# Patient Record
Sex: Female | Born: 1985
Health system: Southern US, Community
[De-identification: ages and names within clinical notes are randomized; demographics above are authoritative.]

## PROBLEM LIST (undated history)

## (undated) ENCOUNTER — Inpatient Hospital Stay (HOSPITAL_COMMUNITY): Payer: Self-pay

## (undated) DIAGNOSIS — R51 Headache: Secondary | ICD-10-CM

## (undated) DIAGNOSIS — A599 Trichomoniasis, unspecified: Secondary | ICD-10-CM

## (undated) DIAGNOSIS — R519 Headache, unspecified: Secondary | ICD-10-CM

## (undated) DIAGNOSIS — R7303 Prediabetes: Secondary | ICD-10-CM

## (undated) DIAGNOSIS — T7840XA Allergy, unspecified, initial encounter: Secondary | ICD-10-CM

## (undated) DIAGNOSIS — Z9289 Personal history of other medical treatment: Secondary | ICD-10-CM

## (undated) DIAGNOSIS — Z973 Presence of spectacles and contact lenses: Secondary | ICD-10-CM

## (undated) HISTORY — DX: Presence of spectacles and contact lenses: Z97.3

## (undated) HISTORY — PX: EYE MUSCLE SURGERY: SHX370

## (undated) HISTORY — DX: Personal history of other medical treatment: Z92.89

## (undated) HISTORY — DX: Headache, unspecified: R51.9

## (undated) HISTORY — PX: WISDOM TOOTH EXTRACTION: SHX21

## (undated) HISTORY — DX: Allergy, unspecified, initial encounter: T78.40XA

---
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
## 2006-11-20 ENCOUNTER — Emergency Department (HOSPITAL_COMMUNITY): Admission: EM | Admit: 2006-11-20 | Discharge: 2006-11-20 | Payer: Self-pay | Admitting: Emergency Medicine

## 2007-04-13 ENCOUNTER — Emergency Department (HOSPITAL_COMMUNITY): Admission: EM | Admit: 2007-04-13 | Discharge: 2007-04-13 | Payer: Self-pay | Admitting: Emergency Medicine

## 2010-05-19 ENCOUNTER — Emergency Department (HOSPITAL_COMMUNITY)
Admission: EM | Admit: 2010-05-19 | Discharge: 2010-05-19 | Disposition: A | Discharge status: Home / Self Care | Payer: No Typology Code available for payment source | Attending: Emergency Medicine | Admitting: Emergency Medicine

## 2010-05-19 DIAGNOSIS — T148XXA Other injury of unspecified body region, initial encounter: Secondary | ICD-10-CM | POA: Insufficient documentation

## 2010-05-19 DIAGNOSIS — S335XXA Sprain of ligaments of lumbar spine, initial encounter: Secondary | ICD-10-CM | POA: Insufficient documentation

## 2010-05-19 DIAGNOSIS — M25519 Pain in unspecified shoulder: Secondary | ICD-10-CM | POA: Insufficient documentation

## 2010-05-19 DIAGNOSIS — M549 Dorsalgia, unspecified: Secondary | ICD-10-CM | POA: Insufficient documentation

## 2010-05-22 ENCOUNTER — Emergency Department (HOSPITAL_COMMUNITY)
Admission: EM | Admit: 2010-05-22 | Discharge: 2010-05-22 | Disposition: A | Discharge status: Home / Self Care | Payer: No Typology Code available for payment source | Attending: Emergency Medicine | Admitting: Emergency Medicine

## 2010-05-22 ENCOUNTER — Emergency Department (HOSPITAL_COMMUNITY): Payer: No Typology Code available for payment source

## 2010-05-22 DIAGNOSIS — R079 Chest pain, unspecified: Secondary | ICD-10-CM | POA: Insufficient documentation

## 2010-05-22 DIAGNOSIS — M545 Low back pain, unspecified: Secondary | ICD-10-CM | POA: Insufficient documentation

## 2010-05-22 DIAGNOSIS — R0602 Shortness of breath: Secondary | ICD-10-CM | POA: Insufficient documentation

## 2010-05-22 DIAGNOSIS — T148XXA Other injury of unspecified body region, initial encounter: Secondary | ICD-10-CM | POA: Insufficient documentation

## 2010-05-22 DIAGNOSIS — M549 Dorsalgia, unspecified: Secondary | ICD-10-CM | POA: Insufficient documentation

## 2010-05-22 DIAGNOSIS — Y92410 Unspecified street and highway as the place of occurrence of the external cause: Secondary | ICD-10-CM | POA: Insufficient documentation

## 2010-06-08 NOTE — Op Note (Signed)
Jenny Yu, Jenny Yu                ACCOUNT NO.:  [id number]   MEDICAL RECORD NO.:  [id number]          PATIENT TYPE:  AMB   LOCATION:  DSC                          FACILITY:  [facility]   PHYSICIAN:  Casimiro Needle A. Karleen Hampshire, M.D.DATE OF BIRTH:  August 04, 1985   DATE OF PROCEDURE:  12/14/2003  DATE OF DISCHARGE:                                 OPERATIVE REPORT   PREOPERATIVE DIAGNOSIS:  Esotropia with dissociated vertical deviation, OU.   PROCEDURE:  Bilateral medial rectus recessions, 5.5 mm, with bilateral  inferior oblique anteriorizations.   SURGEON:  Tyrone Apple. Karleen Hampshire, M.D.   ANESTHESIA:  General with laryngeal mask airway.   INDICATIONS FOR PROCEDURE:  Jenny Yu is a 25 year old black female with  esotropia and dissociated vertical deviation bilaterally. This procedure is  indicated to restore alignment of the visual axis and to improve the visual  function and visual acuity. The risks and benefits of the procedure  explained to the patient and the patient's parents prior to the procedure,  and informed consent was obtained.   DESCRIPTION OF TECHNIQUE:  The patient was taken to the operating room and  placed in a supine position, and after the induction of general anesthesia  and establishment of adequate laryngeal mask airway, the entire face was  prepped and draped in the usual sterile manner. Our attention was first  turned to the right eye. Forced duction testing performed and found to be  negative. The globe was then held in the inferior nasal quadrant, the eye  was elevated and abducted. An incision was made through the inferior nasal  fornix, taken down to the posterior subtenon space. The right medial rectus  muscle was then isolated on a Stevens hook, subsequently on a Green hook. A  second Green hook was then passed beneath the tendon of the muscle. This was  then used to hold the globe in an elevated and abducted position. The right  medial rectus muscle was then carefully  isolated from its overlying muscle  fascia, and the muscular septum was cut, and the tendon was then imbricated  on 6-0 Vicryl suture taking 2 locking bites on the ends. It was then  detached from the globe and recessed exactly 5.5 mm from its insertion,  reattached to the globe using preplaced sutures. The sutures were tied  securely. The conjunctivae was repositioned. An identical left medial rectus  recession 5.5 mm was performed following this using the technique outlined  above. Attention was then turned to the inferior oblique of the left eye.  The globe was held in the inferior temporal quadrant. It was elevated and  adducted. Incision was then made through the inferior temporal fornix, taken  down to the posterior subtenon space. The left lateral rectus muscle was  then isolated on a Stevens hook, subsequently on a Green hook. The second  Green hook was passed beneath the tendon of the muscle. This was used to  hold the globe and elevate it in adducted position. The inferior oblique was  then isolated coursing from its origin and the anterior floor of the orbit  to  its insertion in the posterior inferior temporal quadrant of the globe.  It was carefully dissected free from its overlying muscle fascia. It was  then imbricated with 6-0 Vicryl suture. It was detached from the globe and  the anteriorized to the level of the ipsilateral inferior rectus muscle. It  was reattached using preplaced suture, suture tied securely. The  conjunctivae was repositioned. Attention was then turned to the fellow right  eye where a right inferior oblique anteriorization was performed using a  technique outlined above. There were no apparent complications at the  conclusion of the procedure. Antibiotic ointment was instilled inferior  fornices in both eyes.      MAS/MEDQ  D:  12/14/2003  T:  12/14/2003  Job:  [id number]

## 2010-10-15 LAB — URINALYSIS, ROUTINE W REFLEX MICROSCOPIC
Hgb urine dipstick: NEGATIVE
Nitrite: NEGATIVE
Protein, ur: NEGATIVE
Specific Gravity, Urine: 1.014
Urobilinogen, UA: 0.2

## 2010-10-15 LAB — DIFFERENTIAL
Basophils Absolute: 0
Basophils Relative: 0
Lymphocytes Relative: 33
Neutro Abs: 5.7
Neutrophils Relative %: 61

## 2010-10-15 LAB — BASIC METABOLIC PANEL
BUN: 3 — ABNORMAL LOW
CO2: 24
Calcium: 9.1
Creatinine, Ser: 0.59
GFR calc non Af Amer: >60
Glucose, Bld: 117 — ABNORMAL HIGH
Sodium: 138

## 2010-10-15 LAB — CBC
MCHC: 32.7
Platelets: 515 — ABNORMAL HIGH
RDW: 15.9 — ABNORMAL HIGH

## 2010-10-15 LAB — PREGNANCY, URINE: Preg Test, Ur: NEGATIVE

## 2010-10-31 LAB — RAPID STREP SCREEN (MED CTR MEBANE ONLY): Streptococcus, Group A Screen (Direct): NEGATIVE

## 2013-02-10 ENCOUNTER — Encounter (HOSPITAL_COMMUNITY): Payer: Self-pay | Admitting: *Deleted

## 2013-02-10 ENCOUNTER — Inpatient Hospital Stay (HOSPITAL_COMMUNITY): Payer: BC Managed Care – PPO

## 2013-02-10 ENCOUNTER — Inpatient Hospital Stay (HOSPITAL_COMMUNITY)
Admission: AD | Admit: 2013-02-10 | Discharge: 2013-02-10 | Disposition: A | Discharge status: Home / Self Care | Payer: BC Managed Care – PPO | Source: Ambulatory Visit | Attending: Obstetrics and Gynecology | Admitting: Obstetrics and Gynecology

## 2013-02-10 DIAGNOSIS — O26859 Spotting complicating pregnancy, unspecified trimester: Secondary | ICD-10-CM | POA: Insufficient documentation

## 2013-02-10 DIAGNOSIS — Z3201 Encounter for pregnancy test, result positive: Secondary | ICD-10-CM

## 2013-02-10 DIAGNOSIS — O26851 Spotting complicating pregnancy, first trimester: Secondary | ICD-10-CM

## 2013-02-10 DIAGNOSIS — R109 Unspecified abdominal pain: Secondary | ICD-10-CM | POA: Insufficient documentation

## 2013-02-10 HISTORY — DX: Headache: R51

## 2013-02-10 HISTORY — DX: Trichomoniasis, unspecified: A59.9

## 2013-02-10 LAB — CBC
HCT: 37.6 % (ref 36.0–46.0)
Hemoglobin: 12.3 g/dL (ref 12.0–15.0)
MCH: 25.1 pg — ABNORMAL LOW (ref 26.0–34.0)
MCHC: 32.7 g/dL (ref 30.0–36.0)
MCV: 76.6 fL — AB (ref 78.0–100.0)
PLATELETS: 438 10*3/uL — AB (ref 150–400)
RBC: 4.91 MIL/uL (ref 3.87–5.11)
RDW: 14.3 % (ref 11.5–15.5)
WBC: 12 10*3/uL — AB (ref 4.0–10.5)

## 2013-02-10 LAB — URINALYSIS, ROUTINE W REFLEX MICROSCOPIC
Bilirubin Urine: NEGATIVE
Glucose, UA: NEGATIVE mg/dL
Ketones, ur: NEGATIVE mg/dL
Leukocytes, UA: NEGATIVE
Nitrite: NEGATIVE
Protein, ur: NEGATIVE mg/dL
SPECIFIC GRAVITY, URINE: 1.025 (ref 1.005–1.030)
UROBILINOGEN UA: 0.2 mg/dL (ref 0.0–1.0)
pH: 6.5 (ref 5.0–8.0)

## 2013-02-10 LAB — URINE MICROSCOPIC-ADD ON

## 2013-02-10 LAB — WET PREP, GENITAL
Clue Cells Wet Prep HPF POC: NONE SEEN
TRICH WET PREP: NONE SEEN
YEAST WET PREP: NONE SEEN

## 2013-02-10 LAB — ABO/RH: ABO/RH(D): O POS

## 2013-02-10 LAB — POCT PREGNANCY, URINE: Preg Test, Ur: POSITIVE — AB

## 2013-02-10 LAB — HCG, QUANTITATIVE, PREGNANCY: HCG, BETA CHAIN, QUANT, S: 153 m[IU]/mL — AB (ref <5)

## 2013-02-10 NOTE — Discharge Instructions (Signed)
Ectopic Pregnancy °An ectopic pregnancy is when the fertilized egg attaches (implants) outside the uterus. Most ectopic pregnancies occur in the fallopian tube. Rarely do ectopic pregnancies occur on the ovary, intestine, pelvis, or cervix. In an ectopic pregnancy, the fertilized egg does not have the ability to develop into a normal, healthy baby.  °A ruptured ectopic pregnancy is one in which the fallopian tube gets torn or bursts and results in internal bleeding. Often there is intense abdominal pain, and sometimes, vaginal bleeding. Having an ectopic pregnancy can be life threatening. If left untreated, this dangerous condition can lead to a blood transfusion, abdominal surgery, or even death. °CAUSES  °Damage to the fallopian tubes is the suspected cause in most ectopic pregnancies.  °RISK FACTORS °Depending on your circumstances, the risk of having an ectopic pregnancy will vary. The level of risk can be divided into three categories. °High Risk °· You have gone through infertility treatment. °· You have had a previous ectopic pregnancy. °· You have had previous tubal surgery. °· You have had previous surgery to have the fallopian tubes tied (tubal ligation). °· You have tubal problems or diseases. °· You have been exposed to DES. DES is a medicine that was used until 1971 and had effects on babies whose mothers took the medicine. °· You become pregnant while using an intrauterine device (IUD) for birth control.  °Moderate Risk °· You have a history of infertility. °· You have a history of a sexually transmitted infection (STI). °· You have a history of pelvic inflammatory disease (PID). °· You have scarring from endometriosis. °· You have multiple sexual partners. °· You smoke.  °Low Risk °· You have had previous pelvic surgery. °· You use vaginal douching. °· You became sexually active before 27 years of age. °SIGNS AND SYMPTOMS  °An ectopic pregnancy should be suspected in anyone who has missed a period and  has abdominal pain or bleeding. °· You may experience normal pregnancy symptoms, such as: °· Nausea. °· Tiredness. °· Breast tenderness. °· Other symptoms may include: °· Pain with intercourse. °· Irregular vaginal bleeding or spotting. °· Cramping or pain on one side or in the lower abdomen. °· Fast heartbeat. °· Passing out while having a bowel movement. °· Symptoms of a ruptured ectopic pregnancy and internal bleeding may include: °· Sudden, severe pain in the abdomen and pelvis. °· Dizziness or fainting. °· Pain in the shoulder area. °DIAGNOSIS  °Tests that may be performed include: °· A pregnancy test. °· An ultrasound test. °· Testing the specific level of pregnancy hormone in the bloodstream. °· Taking a sample of uterus tissue (dilation and curettage, D&C). °· Surgery to perform a visual exam of the inside of the abdomen using a thin, lighted tube with a tiny camera on the end (laparoscope). °TREATMENT  °An injection of a medicine called methotrexate may be given. This medicine causes the pregnancy tissue to be absorbed. It is given if: °· The diagnosis is made early. °· The fallopian tube has not ruptured. °· You are considered to be a good candidate for the medicine. °Usually, pregnancy hormone blood levels are checked after methotrexate treatment. This is to be sure the medicine is effective. It may take 4 6 weeks for the pregnancy to be absorbed (though most pregnancies will be absorbed by 3 weeks). °Surgical treatment may be needed. A laparoscope may be used to remove the pregnancy tissue. If severe internal bleeding occurs, a cut (incision) may be made in the lower abdomen (laparotomy), and the   ectopic pregnancy is removed. This stops the bleeding. Part of the fallopian tube, or the whole tube, may be removed as well (salpingectomy). After surgery, pregnancy hormone tests may be done to be sure there is no pregnancy tissue left. You may receive an Rho(D) immune globulin shot if you are Rh negative and  the father is Rh positive, or if you do not know the Rh type of the father. This is to prevent problems with any future pregnancy. SEEK IMMEDIATE MEDICAL CARE IF:  You have any symptoms of an ectopic pregnancy. This is a medical emergency. Document Released: 02/15/2004 Document Revised: 10/28/2012 Document Reviewed: 08/06/2012 [facility] Patient Information 2014 [employer], [place].  Vaginal Bleeding During Pregnancy, First Trimester A small amount of bleeding (spotting) from the vagina is relatively common in early pregnancy. It usually stops on its own. Various things may cause bleeding or spotting in early pregnancy. Some bleeding may be related to the pregnancy, and some may not. In most cases, the bleeding is normal and is not a problem. However, bleeding can also be a sign of something serious. Be sure to tell your health care provider about any vaginal bleeding right away. Some possible causes of vaginal bleeding during the first trimester include:  Infection or inflammation of the cervix.  Growths (polyps) on the cervix.  Miscarriage or threatened miscarriage.  Pregnancy tissue has developed outside of the uterus and in a fallopian tube (tubal pregnancy).  Tiny cysts have developed in the uterus instead of pregnancy tissue (molar pregnancy). HOME CARE INSTRUCTIONS  Watch your condition for any changes. The following actions may help to lessen any discomfort you are feeling:  Follow your health care provider's instructions for limiting your activity. If your health care provider orders bed rest, you may need to stay in bed and only get up to use the bathroom. However, your health care provider may allow you to continue light activity.  If needed, make plans for someone to help with your regular activities and responsibilities while you are on bed rest.  Keep track of the number of pads you use each day, how often you change pads, and how soaked (saturated) they are. Write this down.  Do  not use tampons. Do not douche.  Do not have sexual intercourse or orgasms until approved by your health care provider.  If you pass any tissue from your vagina, save the tissue so you can show it to your health care provider.  Only take over-the-counter or prescription medicines as directed by your health care provider.  Do not take aspirin because it can make you bleed.  Keep all follow-up appointments as directed by your health care provider. SEEK MEDICAL CARE IF:  You have any vaginal bleeding during any part of your pregnancy.  You have cramps or labor pains. SEEK IMMEDIATE MEDICAL CARE IF:   You have severe cramps in your back or belly (abdomen).  You have a fever, not controlled by medicine.  You pass large clots or tissue from your vagina.  Your bleeding increases.  You feel lightheaded or weak, or you have fainting episodes.  You have chills.  You are leaking fluid or have a gush of fluid from your vagina.  You pass out while having a bowel movement. MAKE SURE YOU:  Understand these instructions.  Will watch your condition.  Will get help right away if you are not doing well or get worse. Document Released: 10/17/2004 Document Revised: 10/28/2012 Document Reviewed: 09/14/2012  [facility] Patient Information 2014 [place],  [place]. ° °

## 2013-02-10 NOTE — MAU Provider Note (Signed)
Chief Complaint: Abdominal Pain, possible pregnant  and Vaginal Bleeding   First Provider Initiated Contact with Patient 02/10/13 1603     SUBJECTIVE HPI: Jenny Yu is a 28 y.o. G2P0 at [redacted]w[redacted]d by LMP who presents to maternity admissions reporting positive UPT 3 weeks ago, spotting x1 week with last episode yesterday, and abdominal pain described as severe cramping in the center of her abdomen with onset last night.  She has less pain today but mild pain persists.  She denies exposure to STDs, vaginal itching/burning, urinary symptoms, h/a, dizziness, n/v, or fever/chills.    Past Medical History  Diagnosis Date  . Trichomonas infection   . ZOXWRUEA(540.9)    Past Surgical History  Procedure Laterality Date  . Eye muscle surgery    . Wisdom tooth extraction     History   Social History  . Marital Status: Married    Spouse Name: N/A    Number of Children: N/A  . Years of Education: N/A   Occupational History  . Not on file.   Social History Main Topics  . Smoking status: Never Smoker   . Smokeless tobacco: Never Used  . Alcohol Use: No  . Drug Use: No  . Sexual Activity: Yes    Birth Control/ Protection: None     Comment: Last intercourse 01/30/13   Other Topics Concern  . Not on file   Social History Narrative  . No narrative on file   No current facility-administered medications on file prior to encounter.   No current outpatient prescriptions on file prior to encounter.   No Known Allergies  ROS: Pertinent items in HPI  OBJECTIVE Blood pressure 127/64, pulse 88, temperature 98.5 F (36.9 C), temperature source Oral, resp. rate 18, height 5\' 4"  (1.626 m), weight 95.8 kg (211 lb 3.2 oz), last menstrual period 12/24/2012. GENERAL: Well-developed, well-nourished female in no acute distress.  HEENT: Normocephalic HEART: normal rate RESP: normal effort ABDOMEN: Soft, non-tender EXTREMITIES: Nontender, no edema NEURO: Alert and oriented Pelvic exam: Cervix pink,  visually closed, without lesion, scant white creamy discharge, vaginal walls and external genitalia normal Bimanual exam: Cervix 0/long/high, firm, anterior, neg CMT, uterus nontender, nonenlarged, adnexa without tenderness, enlargement, or mass  LAB RESULTS Results for orders placed during the hospital encounter of 02/10/13 (from the past 24 hour(s))  URINALYSIS, ROUTINE W REFLEX MICROSCOPIC     Status: Abnormal   Collection Time    02/10/13  3:00 PM      Result Value Range   Color, Urine YELLOW  YELLOW   APPearance CLEAR  CLEAR   Specific Gravity, Urine 1.025  1.005 - 1.030   pH 6.5  5.0 - 8.0   Glucose, UA NEGATIVE  NEGATIVE mg/dL   Hgb urine dipstick TRACE (*) NEGATIVE   Bilirubin Urine NEGATIVE  NEGATIVE   Ketones, ur NEGATIVE  NEGATIVE mg/dL   Protein, ur NEGATIVE  NEGATIVE mg/dL   Urobilinogen, UA 0.2  0.0 - 1.0 mg/dL   Nitrite NEGATIVE  NEGATIVE   Leukocytes, UA NEGATIVE  NEGATIVE  URINE MICROSCOPIC-ADD ON     Status: Abnormal   Collection Time    02/10/13  3:00 PM      Result Value Range   Squamous Epithelial / LPF FEW (*) RARE   RBC / HPF 0-2  <3 RBC/hpf   Bacteria, UA RARE  RARE  CBC     Status: Abnormal   Collection Time    02/10/13  3:32 PM      Result  Value Range   WBC 12.0 (*) 4.0 - 10.5 K/uL   RBC 4.91  3.87 - 5.11 MIL/uL   Hemoglobin 12.3  12.0 - 15.0 g/dL   HCT 16.137.6  09.636.0 - 04.546.0 %   MCV 76.6 (*) 78.0 - 100.0 fL   MCH 25.1 (*) 26.0 - 34.0 pg   MCHC 32.7  30.0 - 36.0 g/dL   RDW 40.914.3  81.111.5 - 91.415.5 %   Platelets 438 (*) 150 - 400 K/uL  HCG, QUANTITATIVE, PREGNANCY     Status: Abnormal   Collection Time    02/10/13  3:32 PM      Result Value Range   hCG, Beta Chain, Quant, S 153 (*) <5 mIU/mL  ABO/RH     Status: None   Collection Time    02/10/13  3:32 PM      Result Value Range   ABO/RH(D) O POS    POCT PREGNANCY, URINE     Status: Abnormal   Collection Time    02/10/13  3:50 PM      Result Value Range   Preg Test, Ur POSITIVE (*) NEGATIVE  WET  PREP, GENITAL     Status: Abnormal   Collection Time    02/10/13  4:08 PM      Result Value Range   Yeast Wet Prep HPF POC NONE SEEN  NONE SEEN   Trich, Wet Prep NONE SEEN  NONE SEEN   Clue Cells Wet Prep HPF POC NONE SEEN  NONE SEEN   WBC, Wet Prep HPF POC FEW (*) NONE SEEN    IMAGING [place] Ob Comp Less 14 Wks  02/10/2013   CLINICAL DATA:  Pregnant, spotting, beta HCG 153  EXAM: OBSTETRIC <14 WK US AND TRANSVAGINAL OB US  TECHNIQUE: Both transabdominal and transvaginal ultrasound examinations were performed for complete evaluation of the gestation as well as the maternal uterus, adnexal regions, and pelvic cul-de-sac. Transvaginal technique was performed to assess early pregnancy.  COMPARISON:  None.  FINDINGS: Intrauterine gestational sac: Not visualized  Maternal uterus/adnexae: Endometrial complex measures 15 mm.  Right ovary is within normal limits.  Left ovary is within normal limits and notable for a suspected corpus luteal cyst.  No pelvic ascites.  IMPRESSION: No IUP is visualized. This is not unexpected given the low beta HCG (153).  However, by definition, this reflects a pregnancy of unknown location. Differential considerations include early IUP, abnormal IUP, or nonvisualized ectopic pregnancy.  Serial beta HCG is suggested, supplemented by repeat pelvic sonography in 14 days (or earlier as clinically warranted).   Electronically Signed   By: Charline BillsSriyesh  Krishnan M.D.   On: 02/10/2013 17:23   [place] Ob Transvaginal  02/10/2013   CLINICAL DATA:  Pregnant, spotting, beta HCG 153  EXAM: OBSTETRIC <14 WK US AND TRANSVAGINAL OB US  TECHNIQUE: Both transabdominal and transvaginal ultrasound examinations were performed for complete evaluation of the gestation as well as the maternal uterus, adnexal regions, and pelvic cul-de-sac. Transvaginal technique was performed to assess early pregnancy.  COMPARISON:  None.  FINDINGS: Intrauterine gestational sac: Not visualized  Maternal uterus/adnexae: Endometrial  complex measures 15 mm.  Right ovary is within normal limits.  Left ovary is within normal limits and notable for a suspected corpus luteal cyst.  No pelvic ascites.  IMPRESSION: No IUP is visualized. This is not unexpected given the low beta HCG (153).  However, by definition, this reflects a pregnancy of unknown location. Differential considerations include early IUP, abnormal IUP, or nonvisualized ectopic  pregnancy.  Serial beta HCG is suggested, supplemented by repeat pelvic sonography in 14 days (or earlier as clinically warranted).   Electronically Signed   By: Charline Bills M.D.   On: 02/10/2013 17:23    ASSESSMENT 1. Positive blood pregnancy test   2. Spotting complicating pregnancy in first trimester     PLAN Discharge home with ectopic and bleeding precautions Discussed possible ectopic, miscarriage, or early IUP with pt, importance of returning for repeat labs. Return Friday after 5 for repeat labs, pt has event Friday at 5, may come 1-2 hours early Return to MAU sooner as needed    Medication List    Notice   You have not been prescribed any medications.     Follow-up Information   Follow up with [facility] [place] [facility]. (Return on Friday afternoon for repeat labs.  Return sooner  if symptoms worsen)    Contact information:   [address] [id number] [place] [address] [phone]      Sharen Counter Certified Nurse-Midwife 02/10/2013  5:34 PM

## 2013-02-10 NOTE — MAU Note (Signed)
Pos HPT 2 weeks ago, started spotting on Monday, began having sharp pain last night, continues cramping.

## 2013-02-11 LAB — GC/CHLAMYDIA PROBE AMP
CT Probe RNA: NEGATIVE
GC Probe RNA: NEGATIVE

## 2013-02-11 NOTE — MAU Provider Note (Signed)
Attestation of Attending Supervision of Advanced Practitioner (CNM/NP): Evaluation and management procedures were performed by the Advanced Practitioner under my supervision and collaboration.  I have reviewed the Advanced Practitioner's note and chart, and I agree with the management and plan.  Janki Dike 02/11/2013 8:51 AM

## 2013-02-12 ENCOUNTER — Inpatient Hospital Stay (HOSPITAL_COMMUNITY)
Admission: AD | Admit: 2013-02-12 | Discharge: 2013-02-12 | Disposition: A | Discharge status: Home / Self Care | Payer: BC Managed Care – PPO | Source: Ambulatory Visit | Attending: Obstetrics & Gynecology | Admitting: Obstetrics & Gynecology

## 2013-02-12 DIAGNOSIS — Z3201 Encounter for pregnancy test, result positive: Secondary | ICD-10-CM

## 2013-02-12 DIAGNOSIS — O209 Hemorrhage in early pregnancy, unspecified: Secondary | ICD-10-CM | POA: Insufficient documentation

## 2013-02-12 DIAGNOSIS — Z09 Encounter for follow-up examination after completed treatment for conditions other than malignant neoplasm: Secondary | ICD-10-CM | POA: Insufficient documentation

## 2013-02-12 DIAGNOSIS — R109 Unspecified abdominal pain: Secondary | ICD-10-CM | POA: Insufficient documentation

## 2013-02-12 LAB — HCG, QUANTITATIVE, PREGNANCY: HCG, BETA CHAIN, QUANT, S: 389 m[IU]/mL — AB (ref <5)

## 2013-02-12 NOTE — MAU Note (Signed)
Here for follow up.  Still has occ cramping, no pain right now.  No longer having bleeding, has brownish d/c.

## 2013-02-17 ENCOUNTER — Inpatient Hospital Stay (HOSPITAL_COMMUNITY)
Admission: AD | Admit: 2013-02-17 | Discharge: 2013-02-17 | Disposition: A | Discharge status: Home / Self Care | Payer: BC Managed Care – PPO | Source: Ambulatory Visit | Attending: Obstetrics & Gynecology | Admitting: Obstetrics & Gynecology

## 2013-02-17 ENCOUNTER — Ambulatory Visit (HOSPITAL_COMMUNITY)
Admit: 2013-02-17 | Discharge: 2013-02-17 | Disposition: A | Discharge status: Home / Self Care | Payer: BC Managed Care – PPO | Attending: Obstetrics and Gynecology | Admitting: Obstetrics and Gynecology

## 2013-02-17 DIAGNOSIS — Z3689 Encounter for other specified antenatal screening: Secondary | ICD-10-CM | POA: Insufficient documentation

## 2013-02-17 DIAGNOSIS — Z3201 Encounter for pregnancy test, result positive: Secondary | ICD-10-CM

## 2013-02-17 DIAGNOSIS — O3680X0 Pregnancy with inconclusive fetal viability, not applicable or unspecified: Secondary | ICD-10-CM | POA: Insufficient documentation

## 2013-02-17 DIAGNOSIS — Z09 Encounter for follow-up examination after completed treatment for conditions other than malignant neoplasm: Secondary | ICD-10-CM | POA: Insufficient documentation

## 2013-02-17 DIAGNOSIS — O26859 Spotting complicating pregnancy, unspecified trimester: Secondary | ICD-10-CM | POA: Insufficient documentation

## 2013-02-17 DIAGNOSIS — Z3490 Encounter for supervision of normal pregnancy, unspecified, unspecified trimester: Secondary | ICD-10-CM

## 2013-02-17 NOTE — MAU Note (Signed)
Patient to MAU after ultrasound for viability. Patient states she has some cramping off and on but no bleeding.

## 2013-02-17 NOTE — MAU Provider Note (Signed)
S: 28 y.o. G2P0 @[redacted]w[redacted]d  by LMP presents to MAU for f/u quant hcg.  She was seen 02/10/13 and had quant of 156, ultrasound with no IUP visualized.  Today she denies pain or bleeding.     O: VS stable  Results for orders placed during the hospital encounter of 02/12/13 (from the past 168 hour(s))  HCG, QUANTITATIVE, PREGNANCY   Collection Time    02/12/13  2:08 PM      Result Value Range   hCG, Beta Chain, Quant, S 389 (*) <5 mIU/mL  HCG, QUANTITATIVE, PREGNANCY   Collection Time    02/10/13  3:32 PM      Result Value Range   hCG, Beta Chain, Quant, S 153 (*) <5 mIU/mL  ABO/RH   Collection Time    02/10/13  3:32 PM      Result Value Range   ABO/RH(D) O POS      A: Appropriate rise in quant hcg in 48 hours  P: D/C home Outpatient U/S in 1 week Ectopic precautions given Return to MAU as needed  Sharen CounterLisa Leftwich-Kirby Certified Nurse-Midwife

## 2013-02-17 NOTE — MAU Provider Note (Signed)
Chief Complaint: Follow-up   None    SUBJECTIVE HPI: Jenny Yu is a 28 y.o. G2P0 at [redacted]w[redacted]d by LMP who presents to maternity admissions following outpatient U/S today.  She was seen in MAU 1/21 and 1/23 for spotting and had appropriate rise in quant hcg.  Today she denies pain or bleeding.   Past Medical History  Diagnosis Date  . Trichomonas infection   . [id number](478.2)    Past Surgical History  Procedure Laterality Date  . Eye muscle surgery    . Wisdom tooth extraction     History   Social History  . Marital Status: Married    Spouse Name: N/A    Number of Children: N/A  . Years of Education: N/A   Occupational History  . Not on file.   Social History Main Topics  . Smoking status: Never Smoker   . Smokeless tobacco: Never Used  . Alcohol Use: No  . Drug Use: No  . Sexual Activity: Yes    Birth Control/ Protection: None     Comment: Last intercourse 01/30/13   Other Topics Concern  . Not on file   Social History Narrative  . No narrative on file   No current facility-administered medications on file prior to encounter.   No current outpatient prescriptions on file prior to encounter.   No Known Allergies  ROS: Pertinent items in HPI  OBJECTIVE Blood pressure 115/65, pulse 70, temperature 99.2 F (37.3 C), temperature source Oral, resp. rate 20, last menstrual period 12/24/2012, SpO2 100.00%. GENERAL: Well-developed, well-nourished female in no acute distress.  HEENT: Normocephalic HEART: normal rate RESP: normal effort ABDOMEN: Soft, non-tender EXTREMITIES: Nontender, no edema NEURO: Alert and oriented   LAB RESULTS No results found for this or any previous visit (from the past 24 hour(s)).  IMAGING US Ob Comp Less 14 Wks  02/10/2013   CLINICAL DATA:  Pregnant, spotting, beta HCG 153  EXAM: OBSTETRIC <14 WK [place] AND TRANSVAGINAL OB US  TECHNIQUE: Both transabdominal and transvaginal ultrasound examinations were performed for complete evaluation of  the gestation as well as the maternal uterus, adnexal regions, and pelvic cul-de-sac. Transvaginal technique was performed to assess early pregnancy.  COMPARISON:  None.  FINDINGS: Intrauterine gestational sac: Not visualized  Maternal uterus/adnexae: Endometrial complex measures 15 mm.  Right ovary is within normal limits.  Left ovary is within normal limits and notable for a suspected corpus luteal cyst.  No pelvic ascites.  IMPRESSION: No IUP is visualized. This is not unexpected given the low beta HCG (153).  However, by definition, this reflects a pregnancy of unknown location. Differential considerations include early IUP, abnormal IUP, or nonvisualized ectopic pregnancy.  Serial beta HCG is suggested, supplemented by repeat pelvic sonography in 14 days (or earlier as clinically warranted).   Electronically Signed   By: Charline Bills M.D.   On: 02/10/2013 17:23   US Ob Transvaginal  02/17/2013   CLINICAL DATA:  Inconclusive rising beta HCG levels. Gestational age by LMP of 7 weeks 6 days.  EXAM: TRANSVAGINAL OB ULTRASOUND  TECHNIQUE: Transvaginal ultrasound was performed for complete evaluation of the gestation as well as the maternal uterus, adnexal regions, and pelvic cul-de-sac.  COMPARISON:  None.  FINDINGS: Intrauterine gestational sac: Visualized/normal in shape.  Yolk sac:  Visualized  Embryo:  Not visualized  MSD: 6  mm   5 w   1  d  Maternal uterus/adnexae: No subchorionic hemorrhage seen. Both ovaries are normal in appearance. No evidence of adnexal  mass or free fluid.  IMPRESSION: Single early 5 week intrauterine gestational sac now visualized. No adnexal mass or free fluid identified.   Electronically Signed   By: Myles RosenthalJohn  Stahl M.D.   On: 02/17/2013 12:35    ASSESSMENT 1. Normal IUP (intrauterine pregnancy) on prenatal ultrasound     PLAN Discharge home Outpatient viability U/S ordered  Pt has applied for pregnancy Medicaid Will start prenatal care with [facility] when Medicaid  approved Return to MAU as needed    Medication List    Notice   You have not been prescribed any medications.     Follow-up Information   [facility]   Specialty:  Radiology   Contact information:   [address] [id number] [facility] [place] [id number] [phone]      Sharen CounterLisa Leftwich-Kirby Certified Nurse-Midwife 02/17/2013  1:44 PM

## 2013-03-03 ENCOUNTER — Ambulatory Visit (HOSPITAL_COMMUNITY)
Admission: RE | Admit: 2013-03-03 | Discharge: 2013-03-03 | Disposition: A | Discharge status: Home / Self Care | Payer: BC Managed Care – PPO | Source: Ambulatory Visit | Attending: Advanced Practice Midwife | Admitting: Advanced Practice Midwife

## 2013-03-03 DIAGNOSIS — Z3689 Encounter for other specified antenatal screening: Secondary | ICD-10-CM | POA: Insufficient documentation

## 2013-03-03 DIAGNOSIS — Z3490 Encounter for supervision of normal pregnancy, unspecified, unspecified trimester: Secondary | ICD-10-CM

## 2013-04-14 ENCOUNTER — Other Ambulatory Visit: Payer: Self-pay

## 2013-04-15 ENCOUNTER — Other Ambulatory Visit: Payer: Self-pay | Admitting: Obstetrics and Gynecology

## 2013-04-15 DIAGNOSIS — IMO0001 Reserved for inherently not codable concepts without codable children: Secondary | ICD-10-CM

## 2013-04-27 ENCOUNTER — Encounter (HOSPITAL_COMMUNITY): Payer: Self-pay

## 2013-04-27 ENCOUNTER — Encounter (HOSPITAL_COMMUNITY): Payer: BC Managed Care – PPO

## 2013-04-27 ENCOUNTER — Ambulatory Visit (HOSPITAL_COMMUNITY)
Admission: RE | Admit: 2013-04-27 | Discharge: 2013-04-27 | Disposition: A | Discharge status: Home / Self Care | Payer: BC Managed Care – PPO | Source: Ambulatory Visit | Attending: Obstetrics and Gynecology | Admitting: Obstetrics and Gynecology

## 2013-04-27 DIAGNOSIS — O30019 Twin pregnancy, monochorionic/monoamniotic, unspecified trimester: Secondary | ICD-10-CM | POA: Insufficient documentation

## 2013-04-27 DIAGNOSIS — O30009 Twin pregnancy, unspecified number of placenta and unspecified number of amniotic sacs, unspecified trimester: Secondary | ICD-10-CM | POA: Insufficient documentation

## 2013-04-27 DIAGNOSIS — O30002 Twin pregnancy, unspecified number of placenta and unspecified number of amniotic sacs, second trimester: Secondary | ICD-10-CM

## 2013-04-27 DIAGNOSIS — IMO0001 Reserved for inherently not codable concepts without codable children: Secondary | ICD-10-CM

## 2013-04-27 NOTE — Consult Note (Signed)
Maternal Fetal Medicine Consultation  Requesting Provider(s): Jaymes GraffNaima Dillard, MD  Reason for consultation: Mono- mono twin gestation  HPI: Jenny Yu is a 28 year-old G1P0, EDD 10/25/2013 currently at 3646w1d who was seen for consultation due to recent diagnosis of MC/MA twin gestation.  The patient had an early ultrasound in January 2015 that showed only a single gestational sac.  Office ultrasounds showed a twin gestation without evidence of a dividing membrane.  Her prenatal course thus far has otherwise been uncomplicated.  She is without complaints today.  OB History: OB History   Grav Para Term Preterm Abortions TAB SAB Ect Mult Living   1         0      PMH:  Past Medical History  Diagnosis Date  . Trichomonas infection   . Headache(784.0)     PSH:  Past Surgical History  Procedure Laterality Date  . Eye muscle surgery    . Wisdom tooth extraction     Meds:  Prenatal vitamins  Allergies: No Known Allergies  FH: strong family history of twins.  Denies family history of birth defects or hereditary disorders  Soc:  History   Social History  . Marital Status: Married    Spouse Name: N/A    Number of Children: N/A  . Years of Education: N/A   Occupational History  . Not on file.   Social History Main Topics  . Smoking status: Never Smoker   . Smokeless tobacco: Never Used  . Alcohol Use: No  . Drug Use: No  . Sexual Activity: Yes    Birth Control/ Protection: None     Comment: Last intercourse 01/30/13   Other Topics Concern  . Not on file   Social History Narrative  . No narrative on file    Review of Systems: no vaginal bleeding or cramping/contractions, no LOF, no nausea/vomiting. All other systems reviewed and are negative.   PE:  VS: BP  116/58                  pulse 98                  Weight 212 lbs  GEN: well-appearing female ABD: gravid, NT  Ultrasound: Monoamniotic twin gestation with best dates of 14w 1d A single posterior placenta  is noted A dividing membrane is not visualized Limited views of the anatomy obtained due to early gestational age  A/P: 1) Monoamniotic twin gestation at 5346w1d - no dividing membrane is visualized, and given history of a single gestational sac in early pregnancy as well as a single posterior placenta makes the diagnosis very likely.  We had a brief discussion about monoamniotic twins - significant risk of fetal loss (may be as high as at 55% in some series) due to cord entanglement as well as other complications including preterm birth, risk of TTTS (thought to occur in 2-6% of monoamniotic twins) as well as fetal anomalies.  We discussed the need for close fetal monitoring - including inpatient observation for continuous monitoring once viability is reached and the recommendation for cesarean delivery at 32 weeks to avoid the potential risk of cord entanglement and perinatal loss after that gestation.  Recommend: 1) Due to risk of TTTS and to check for viability, recommend limited ultrasounds every 2 weeks for fluid checks beginning at [redacted] weeks gestation. 2) Detailed ultrasound for anatomy at 18 weeks.  We have tentatively scheduled the patient to return to see us at that  time for this study.  We will try to arrange an outpatient consult with the NICU at that time.  She will need ultrasound for fetal growth every 4 weeks thereafter. 3) Fetal echos at approximately 20 weeks (scheduled) 4) After the patient meets with the NICU, she will decide at what gestational age she would prefer admission for either continuous or very close fetal monitoring (at least 3x daily fetal strips) and betamethasone.  In general, we would recommend admission ~ [redacted] weeks gestation but this will ultimately be determined by the patient and her desire for full intervention in the event of fetal distress. 5) We recommend cesarean delivery at 32 weeks.  Although there is no consensus for the optimal timing of delivery, the risk of  perinatal mortality is approximately 10% after 32 weeks (by comparison, the risk of neonatal mortality is 1-2% at 32 weeks).     Thank you for the opportunity to be a part of the care of Jenny Yu. Please contact our office if we can be of further assistance.   I spent approximately 30 minutes with this patient with over 50% of time spent in face-to-face counseling.  Alpha Gula, MD Maternal-Fetal Medicine

## 2013-04-28 ENCOUNTER — Other Ambulatory Visit: Payer: Self-pay | Admitting: Obstetrics and Gynecology

## 2013-04-28 DIAGNOSIS — IMO0001 Reserved for inherently not codable concepts without codable children: Secondary | ICD-10-CM

## 2013-05-11 ENCOUNTER — Encounter (HOSPITAL_COMMUNITY): Payer: Self-pay

## 2013-05-11 ENCOUNTER — Ambulatory Visit (HOSPITAL_COMMUNITY)
Admission: RE | Admit: 2013-05-11 | Discharge: 2013-05-11 | Disposition: A | Discharge status: Home / Self Care | Payer: BC Managed Care – PPO | Source: Ambulatory Visit | Attending: Obstetrics and Gynecology | Admitting: Obstetrics and Gynecology

## 2013-05-11 ENCOUNTER — Other Ambulatory Visit: Payer: Self-pay | Admitting: Obstetrics and Gynecology

## 2013-05-11 DIAGNOSIS — IMO0001 Reserved for inherently not codable concepts without codable children: Secondary | ICD-10-CM

## 2013-05-11 DIAGNOSIS — Z3689 Encounter for other specified antenatal screening: Secondary | ICD-10-CM | POA: Insufficient documentation

## 2013-05-11 DIAGNOSIS — O30009 Twin pregnancy, unspecified number of placenta and unspecified number of amniotic sacs, unspecified trimester: Secondary | ICD-10-CM | POA: Insufficient documentation

## 2013-05-25 ENCOUNTER — Encounter (HOSPITAL_COMMUNITY): Payer: Self-pay

## 2013-05-25 ENCOUNTER — Other Ambulatory Visit: Payer: Self-pay | Admitting: Obstetrics and Gynecology

## 2013-05-25 ENCOUNTER — Ambulatory Visit (HOSPITAL_COMMUNITY)
Admission: RE | Admit: 2013-05-25 | Discharge: 2013-05-25 | Disposition: A | Discharge status: Home / Self Care | Payer: BC Managed Care – PPO | Source: Ambulatory Visit | Attending: Obstetrics and Gynecology | Admitting: Obstetrics and Gynecology

## 2013-05-25 DIAGNOSIS — Z3689 Encounter for other specified antenatal screening: Secondary | ICD-10-CM | POA: Insufficient documentation

## 2013-05-25 DIAGNOSIS — IMO0001 Reserved for inherently not codable concepts without codable children: Secondary | ICD-10-CM

## 2013-05-25 DIAGNOSIS — O30009 Twin pregnancy, unspecified number of placenta and unspecified number of amniotic sacs, unspecified trimester: Secondary | ICD-10-CM | POA: Insufficient documentation

## 2013-06-08 ENCOUNTER — Ambulatory Visit (HOSPITAL_COMMUNITY): Payer: BC Managed Care – PPO

## 2013-06-09 ENCOUNTER — Encounter (HOSPITAL_COMMUNITY): Payer: Self-pay

## 2013-06-09 ENCOUNTER — Ambulatory Visit (HOSPITAL_COMMUNITY)
Admission: RE | Admit: 2013-06-09 | Discharge: 2013-06-09 | Disposition: A | Discharge status: Home / Self Care | Payer: BC Managed Care – PPO | Source: Ambulatory Visit | Attending: Obstetrics and Gynecology | Admitting: Obstetrics and Gynecology

## 2013-06-09 DIAGNOSIS — O30019 Twin pregnancy, monochorionic/monoamniotic, unspecified trimester: Secondary | ICD-10-CM | POA: Insufficient documentation

## 2013-06-09 DIAGNOSIS — O30009 Twin pregnancy, unspecified number of placenta and unspecified number of amniotic sacs, unspecified trimester: Secondary | ICD-10-CM | POA: Insufficient documentation

## 2013-06-09 DIAGNOSIS — IMO0001 Reserved for inherently not codable concepts without codable children: Secondary | ICD-10-CM

## 2013-06-09 DIAGNOSIS — Z3689 Encounter for other specified antenatal screening: Secondary | ICD-10-CM | POA: Insufficient documentation

## 2013-06-10 NOTE — Consult Note (Signed)
Asked by Dr Harlon FlorWhitaker to speak to Mrs Jenny Yu to discuss viability, prognosis, and expectations during hospitalizations for preterm babies. She is 20 2/[redacted] wks pregnant with monochorionic/monoamniotic twin gestation.  Last US available for review on EPIC before this consult was done on 4/7, therefore no current info on growth patterns.   She has a niece who was a former 27 wk preterm, now 27 yo who was in our NICU, doing well.  I spoke to Mrs Jenny Yu in MFM Clinic before her scheduled US. I discussed start of viability at 24 wks based on lung development. I discussed survival rate for singletons at this gestation which will be less for mono/mono twins.  I reiterated  perinatal complications that can occur that Dr Wilhemena DurieWhitacker had mentioned during his consult that can affect the babies postnatally: twin to twin transfusion, cord entanglement, preterm birth, and fetal anomalies.  I also discussed increased incidence of LBW and increased postnatal deaths with twins compared to singletons.  I  discussed betamethasone for its pulmonary effects on the babies and its secondary effects at decreasing chances of big IVH. I discussed NICU team presence and resuscitation at delivery. I also discussed breastfeeding and its benefits.  Her questions were: LOS based on 32 weeks as it is recommended that she be delivered around 32 wks.. How breastfeeding is achieved in preterms. I answered these questions to her satisfaction.  Thank you for inviting us to be a part of coordination of Mrs Yu's care. Our service will look forward to following her progress with you.  I spent 40 min with Mrs Jenny Yu, more than 50% of the time was spent with face to face counseling.   Jenny Garfinkelita Q Eesha Schmaltz, MD Neonatologist

## 2013-06-15 ENCOUNTER — Encounter: Payer: Self-pay | Admitting: *Deleted

## 2013-06-22 ENCOUNTER — Ambulatory Visit (HOSPITAL_COMMUNITY)
Admission: RE | Admit: 2013-06-22 | Discharge: 2013-06-22 | Disposition: A | Discharge status: Home / Self Care | Payer: BC Managed Care – PPO | Source: Ambulatory Visit | Attending: Obstetrics and Gynecology | Admitting: Obstetrics and Gynecology

## 2013-06-22 DIAGNOSIS — IMO0001 Reserved for inherently not codable concepts without codable children: Secondary | ICD-10-CM

## 2013-06-22 DIAGNOSIS — Z3689 Encounter for other specified antenatal screening: Secondary | ICD-10-CM | POA: Insufficient documentation

## 2013-06-22 DIAGNOSIS — O30009 Twin pregnancy, unspecified number of placenta and unspecified number of amniotic sacs, unspecified trimester: Secondary | ICD-10-CM | POA: Insufficient documentation

## 2013-07-01 ENCOUNTER — Ambulatory Visit (INDEPENDENT_AMBULATORY_CARE_PROVIDER_SITE_OTHER): Payer: Self-pay | Admitting: Pediatrics

## 2013-07-01 DIAGNOSIS — Z7681 Expectant parent(s) prebirth pediatrician visit: Secondary | ICD-10-CM

## 2013-07-01 NOTE — Progress Notes (Signed)
Prenatal consult for mother expecting monochorionic-monozygotic female twins to be delivered at [redacted] weeks EGA on 08/27/2013. Mother to be admitted for observation next week for remainder of pregnancy, secondary to concern for umbilical cords becoming entangled Discussed basic logistics of practice access, after hours contact, providers, well and acute care scheduling Answered mother's questions, discussed practice philosophy on routine vaccine schedule (in favor) and well child schedule Tried to give mother some sense of what NICU stay will be like and the risks involved for 32 week infants

## 2013-07-06 ENCOUNTER — Encounter (HOSPITAL_COMMUNITY): Payer: Self-pay

## 2013-07-06 ENCOUNTER — Ambulatory Visit (HOSPITAL_COMMUNITY)
Admission: RE | Admit: 2013-07-06 | Discharge: 2013-07-06 | Disposition: A | Discharge status: Home / Self Care | Payer: BC Managed Care – PPO | Source: Ambulatory Visit | Attending: Obstetrics and Gynecology | Admitting: Obstetrics and Gynecology

## 2013-07-06 DIAGNOSIS — O30009 Twin pregnancy, unspecified number of placenta and unspecified number of amniotic sacs, unspecified trimester: Secondary | ICD-10-CM | POA: Insufficient documentation

## 2013-07-06 DIAGNOSIS — Z3689 Encounter for other specified antenatal screening: Secondary | ICD-10-CM | POA: Insufficient documentation

## 2013-07-06 DIAGNOSIS — IMO0001 Reserved for inherently not codable concepts without codable children: Secondary | ICD-10-CM

## 2013-11-22 ENCOUNTER — Encounter (HOSPITAL_COMMUNITY): Payer: Self-pay

## 2013-12-16 ENCOUNTER — Encounter (HOSPITAL_COMMUNITY): Payer: Self-pay | Admitting: *Deleted

## 2014-11-29 IMAGING — US US OB DETAIL EACH ADDL GEST + 14 WK
1 series · 14 of 28 positions shown · non-contrast
Comparison: none

[Series 1: us ob detail each addl gest + 14 wk · 0.23mm/px · 14 of 160 slices shown]
[im 6/160]
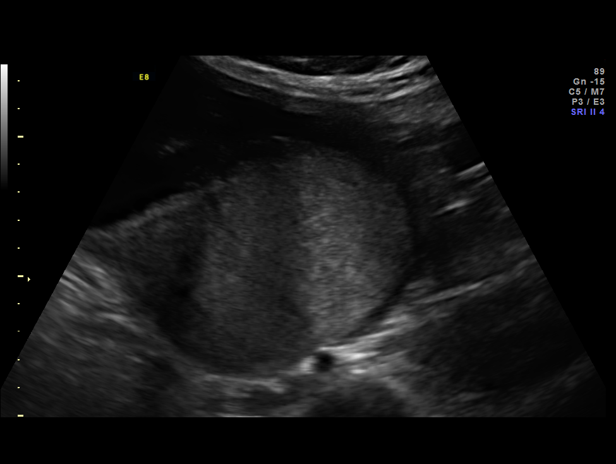
[im 18/160]
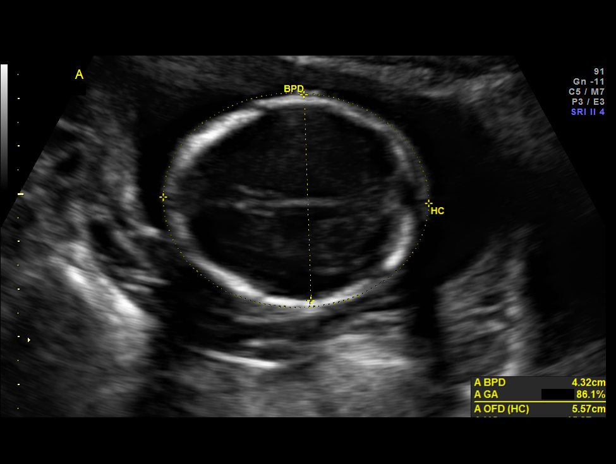
[im 30/160]
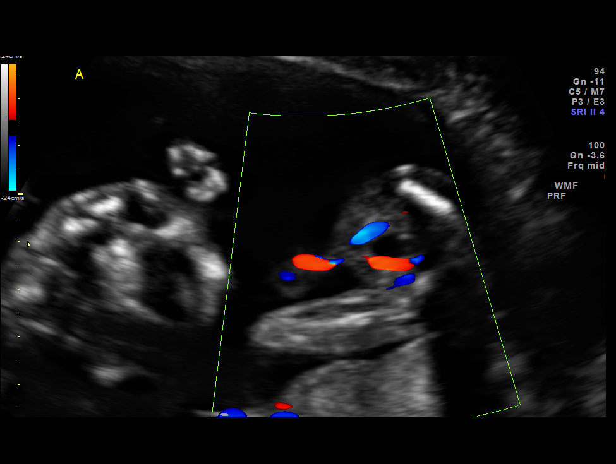
[im 42/160]
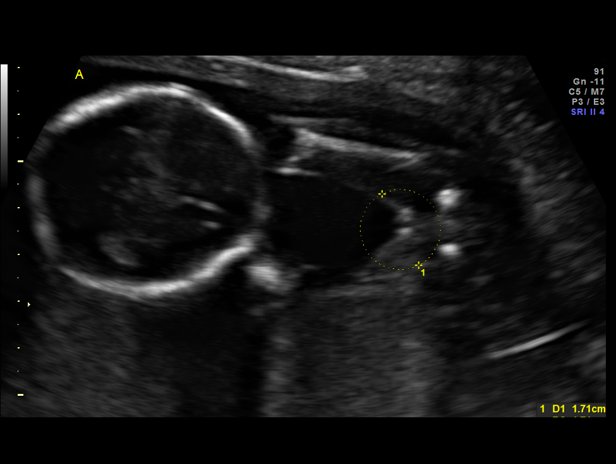
[im 54/160]
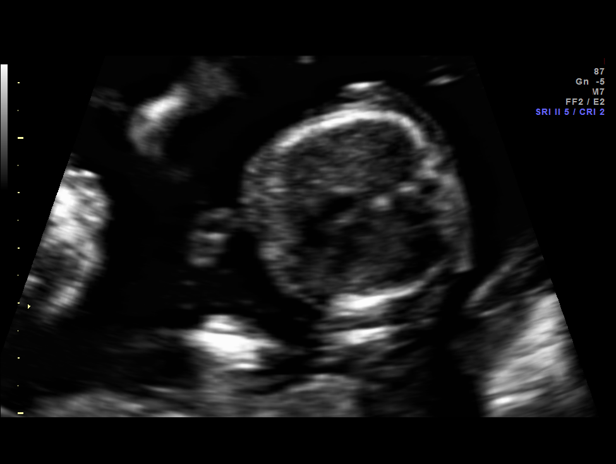
[im 65/160]
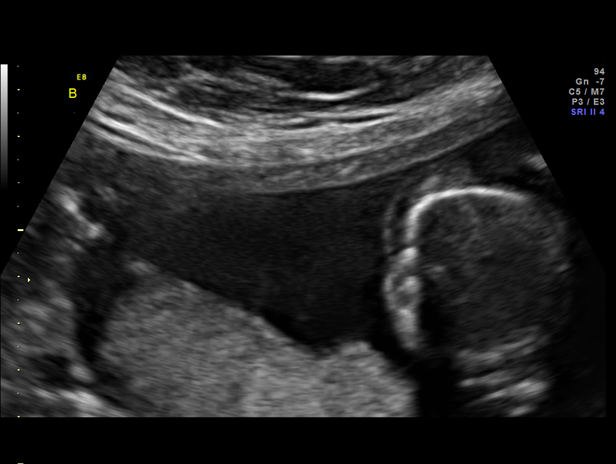
[im 77/160]
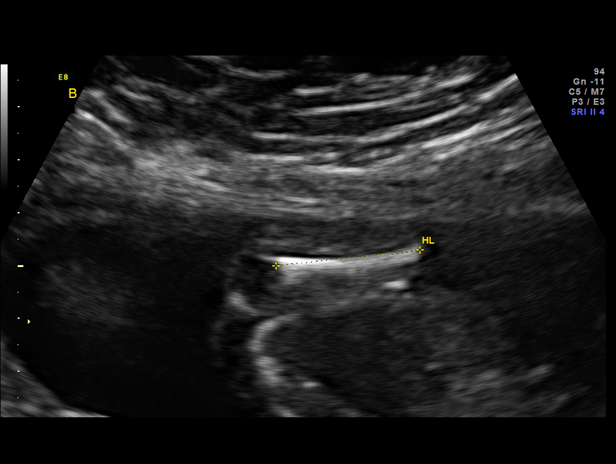
[im 89/160]
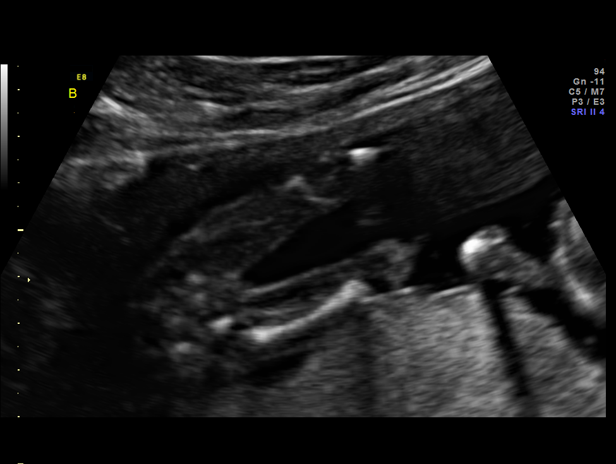
[im 101/160]
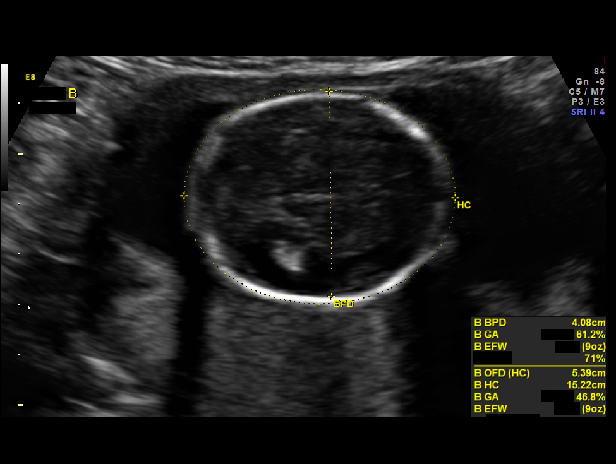
[im 112/160]
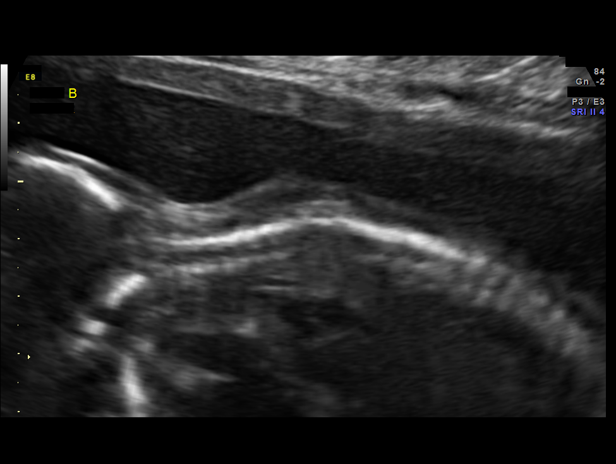
[im 124/160]
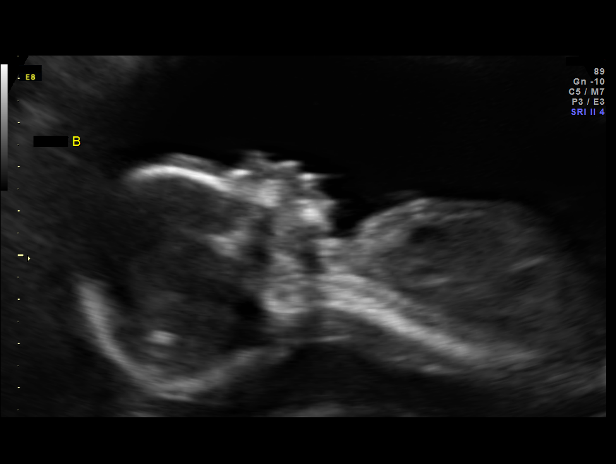
[im 136/160]
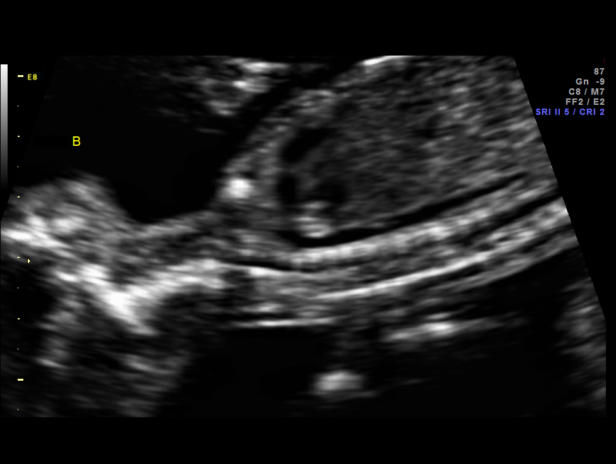
[im 148/160]
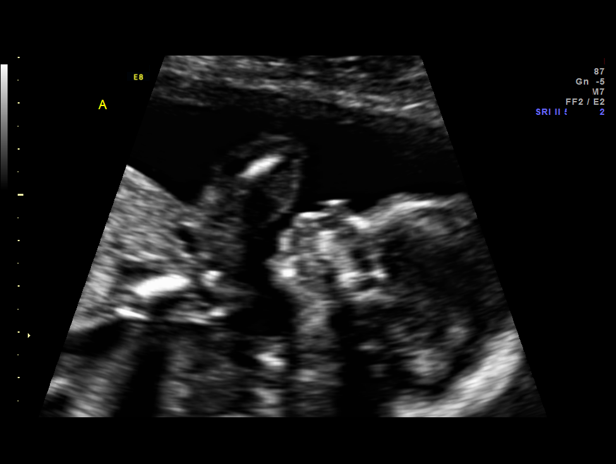
[im 160/160]
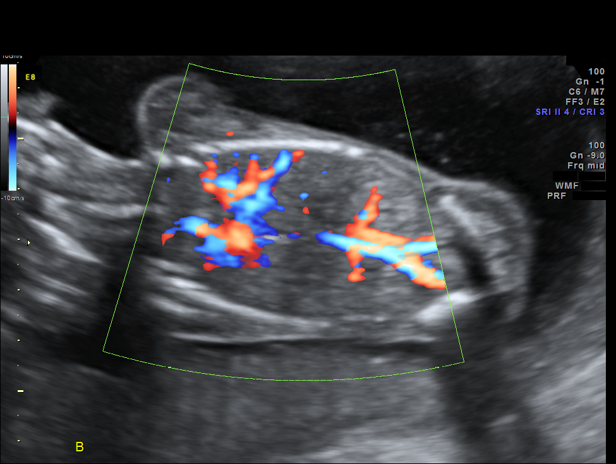

[14 of 28 positions shown; findings below may reference images not displayed]

OBSTETRICS REPORT
                      (Signed Final 05/25/2013 [DATE])

Service(s) Provided

 US OB DETAIL + 14 WK                                  76811.0
 US OB DETAIL ADDL GEST + 14 WK                        76811.1
Indications

 Detailed fetal anatomic survey
 Twin gestation, Mo-Mo
Fetal Evaluation (Fetus A)

 Num Of Fetuses:    2
 Fetal Heart Rate:  178                          bpm
 Cardiac Activity:  Observed
 Fetal Lie:         Lower Fetus
 Presentation:      Cephalic
 Placenta:          Posterior, above cervical
                    os
 P. Cord            Previously Visualized
 Insertion:

 Membrane Desc:     No membrane
                    visualized

 Amniotic Fluid
 AFI FV:      Subjectively within normal limits
Biometry (Fetus A)

 BPD:     43.1  mm     G. Age:  19w 0d                CI:         76.7   70 - 86
 OFD:     56.2  mm                                    FL/HC:      17.9   15.8 -
                                                                         18
 HC:     160.6  mm     G. Age:  18w 6d       77  %    HC/AC:      1.23   1.07 -

 AC:     131.1  mm     G. Age:  18w 4d       64  %    FL/BPD:
 FL:      28.7  mm     G. Age:  18w 6d       69  %    FL/AC:      21.9   20 - 24
 HUM:     26.4  mm     G. Age:  18w 2d       61  %
 CER:     18.2  mm     G. Age:  18w 1d       48  %
 NFT:      3.3  mm

 Est. FW:     255  gm      0 lb 9 oz     58  %     FW Discordancy      0 \ 2 %
Gestational Age (Fetus A)

 LMP:           18w 1d        Date:  01/18/13                 EDD:   10/25/13
 U/S Today:     18w 6d                                        EDD:   10/20/13
 Best:          18w 1d     Det. By:  LMP  (01/18/13)          EDD:   10/25/13
Anatomy (Fetus A)

 Cranium:          Appears normal         Aortic Arch:      Appears normal
 Fetal Cavum:      Appears normal         Ductal Arch:      Appears normal
 Ventricles:       Appears normal         Diaphragm:        Appears normal
 Choroid Plexus:   Appears normal         Stomach:          Appears normal, left
                                                            sided
 Cerebellum:       Appears normal         Abdomen:          Appears normal
 Posterior Fossa:  Appears normal         Abdominal Wall:   Appears nml (cord
                                                            insert, abd wall)
 Nuchal Fold:      Appears normal         Cord Vessels:     Appears normal (3
                                                            vessel cord)
 Face:             Appears normal         Kidneys:          Appear normal
                   (orbits and profile)
 Lips:             Appears normal         Bladder:          Appears normal
 Heart:            Appears normal         Spine:            Appears normal
                   (4CH, axis, and
                   situs)
 RVOT:             Appears normal         Lower             Appears normal
                                          Extremities:
 LVOT:             Appears normal         Upper             Appears normal
                                          Extremities:

 Other:  Fetus appears to be a female. Heels appears normal.
Targeted Anatomy (Fetus A)

 Fetal Central Nervous System
 Cisterna Magna:

Fetal Evaluation (Fetus B)

 Num Of Fetuses:    2
 Fetal Heart Rate:  178                          bpm
 Cardiac Activity:  Observed
 Fetal Lie:         Left Fetus
 Presentation:      Cephalic
 Placenta:          Posterior, above cervical
                    os
 P. Cord            Previously Visualized
 Insertion:

 Membrane Desc:     No membrane
                    visualized

 Amniotic Fluid
 AFI FV:      Subjectively within normal limits
Biometry (Fetus B)

 BPD:     41.4  mm     G. Age:  18w 4d                CI:         76.0   70 - 86
 OFD:     54.5  mm                                    FL/HC:      18.6   15.8 -
                                                                         18
 HC:     154.4  mm     G. Age:  18w 3d       55  %    HC/AC:      1.19   1.07 -

 AC:     129.9  mm     G. Age:  18w 4d       60  %    FL/BPD:
 FL:      28.7  mm     G. Age:  18w 6d       69  %    FL/AC:      22.1   20 - 24
 HUM:     27.4  mm     G. Age:  18w 5d       72  %
 CER:     19.5  mm     G. Age:  18w 5d       69  %
 NFT:      4.9  mm
 Est. FW:     250  gm      0 lb 9 oz     56  %     FW Discordancy         2  %
Gestational Age (Fetus B)

 LMP:           18w 1d        Date:  01/18/13                 EDD:   10/25/13
 U/S Today:     18w 4d                                        EDD:   10/22/13
 Best:          18w 1d     Det. By:  LMP  (01/18/13)          EDD:   10/25/13
Anatomy (Fetus B)

 Cranium:          Appears normal         Aortic Arch:      Appears normal
 Fetal Cavum:      Appears normal         Ductal Arch:      Appears normal
 Ventricles:       Appears normal         Diaphragm:        Appears normal
 Choroid Plexus:   Appears normal         Stomach:          Appears normal, left
                                                            sided
 Cerebellum:       Appears normal         Abdomen:          Appears normal
 Posterior Fossa:  Appears normal         Abdominal Wall:   Appears nml (cord
                                                            insert, abd wall)
 Nuchal Fold:      Appears normal         Cord Vessels:     Appears normal (3
                                                            vessel cord)
 Face:             Appears normal         Kidneys:          Appear normal
                   (orbits and profile)
 Lips:             Appears normal         Bladder:          Appears normal
 Heart:            Appears normal         Spine:            Appears normal
                   (4CH, axis, and
                   situs)
 RVOT:             Appears normal         Lower             Appears normal
                                          Extremities:
 LVOT:             Appears normal         Upper             Appears normal
                                          Extremities:

 Other:  Fetus appears to be a female. Heels and 5th digit appear normal.
Targeted Anatomy (Fetus B)

 Fetal Central Nervous System
 Cisterna Magna:
Cervix Uterus Adnexa

 Cervical Length:    3.9      cm

 Cervix:       Normal appearance by transabdominal scan. Appears
               closed, without funnelling.
Impression

 Monochorionic/monoamniotic twin pregnancy at 18+1 weeks
 Normal detailed fetal anatomy x 2
 Markers of aneuploidy: none x 2
 Normal amniotic fluid volume - normal
 Measurements consistent with LMP dating x 2
Recommendations

 Follow-up ultrasound in 2 weeks to reassess for TTTS
 Follow-up ultrasound for growth in 4 weeks (also meeting
 NICU attending)
 questions or concerns.

## 2014-12-27 IMAGING — US US OB FOLLOW-UP EACH ADDL GEST (MODIFY)
1 series · 15 of 28 positions shown · non-contrast
Comparison: none

[Series 1: us ob follow-up each addl gest (modify) · 0.30mm/px · 15 of 55 slices shown]
[im 1/55]
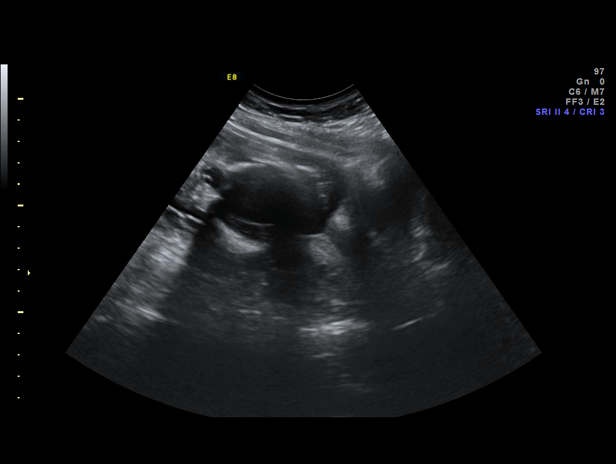
[im 5/55]
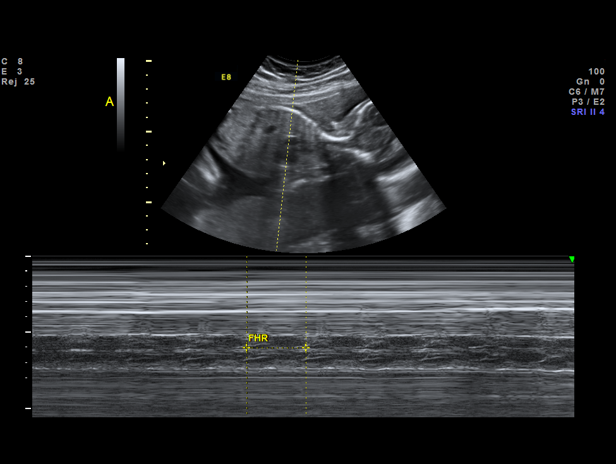
[im 9/55]
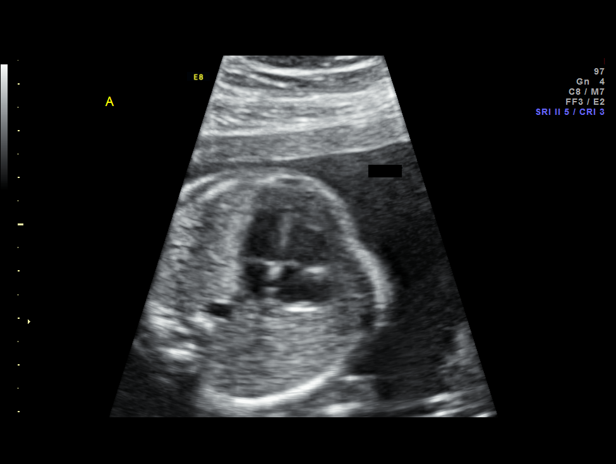
[im 13/55]
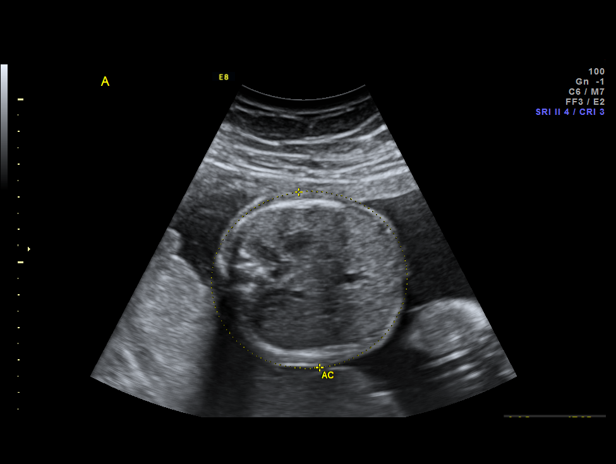
[im 17/55]
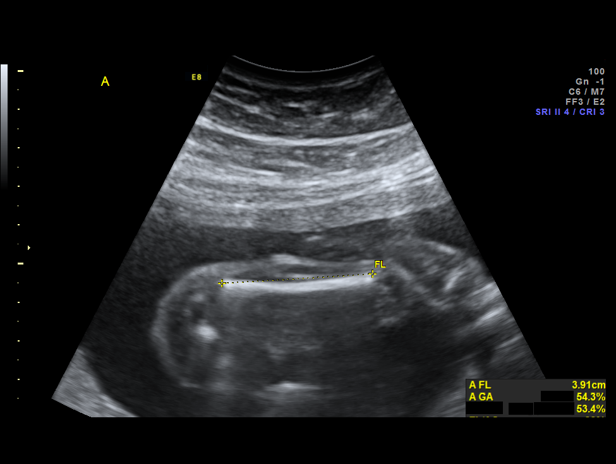
[im 21/55]
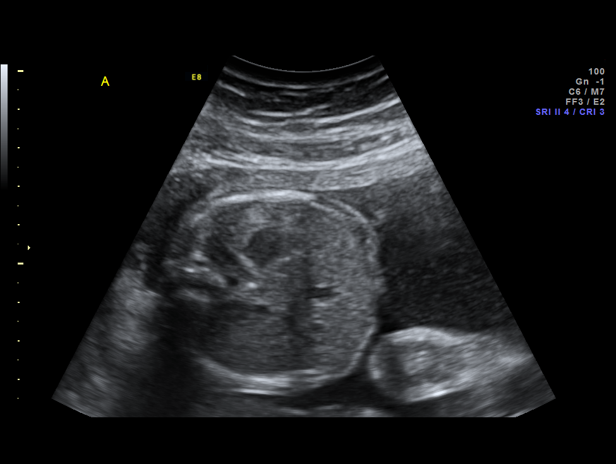
[im 25/55]
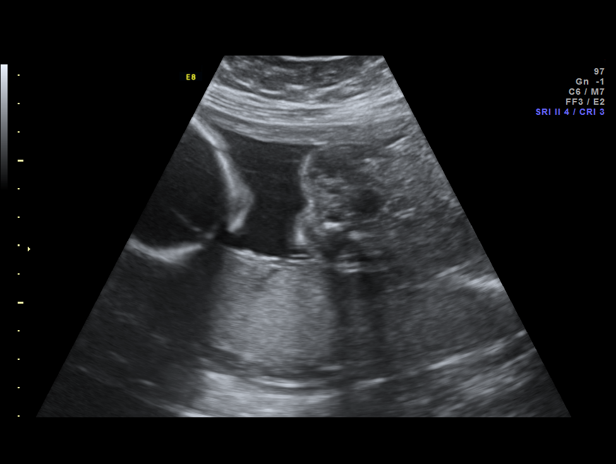
[im 29/55]
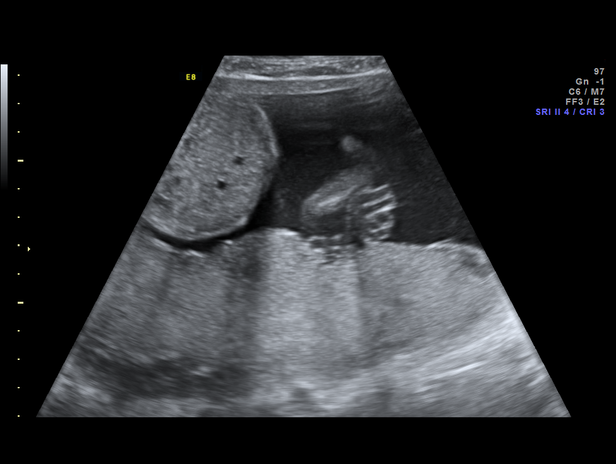
[im 31/55]
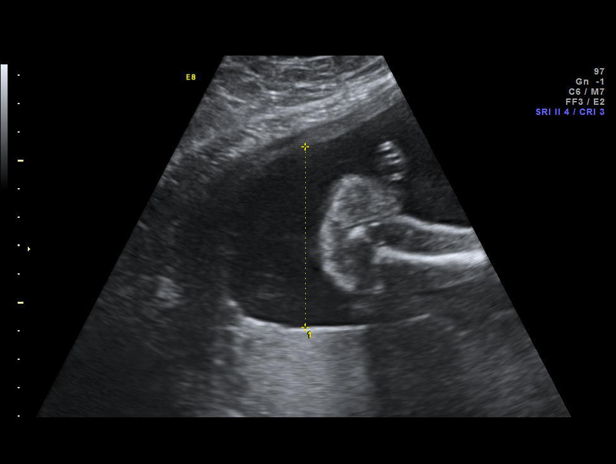
[im 35/55]
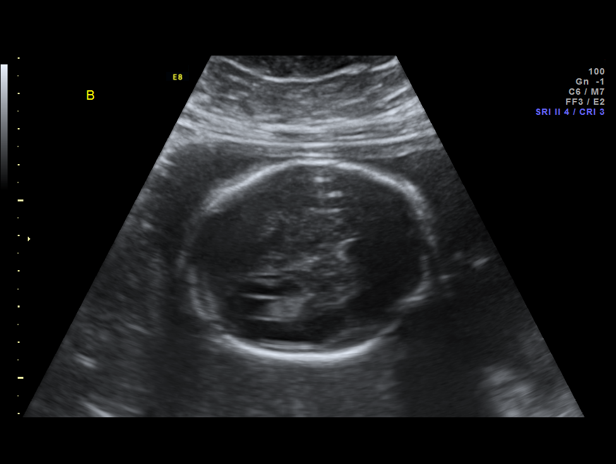
[im 39/55]
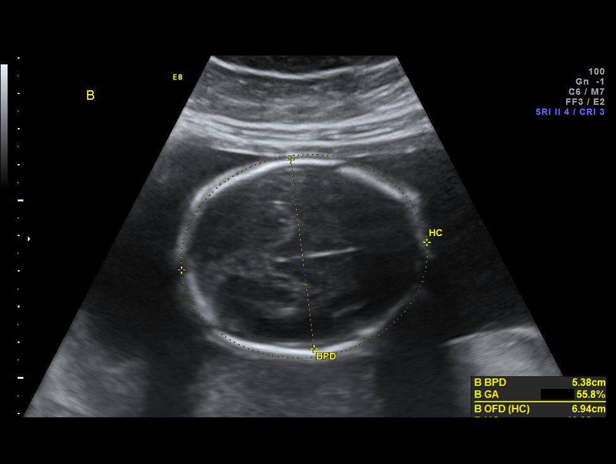
[im 43/55]
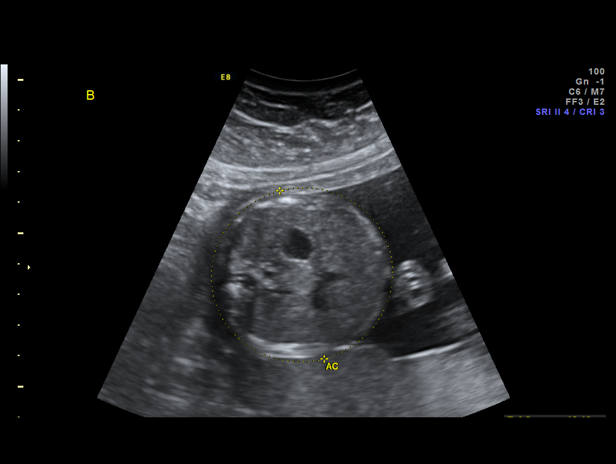
[im 47/55]
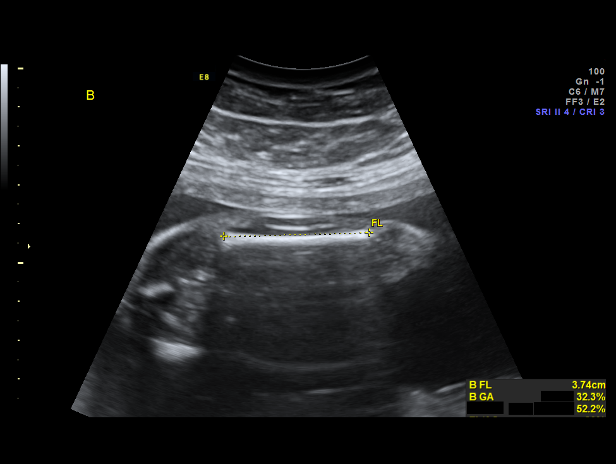
[im 51/55]
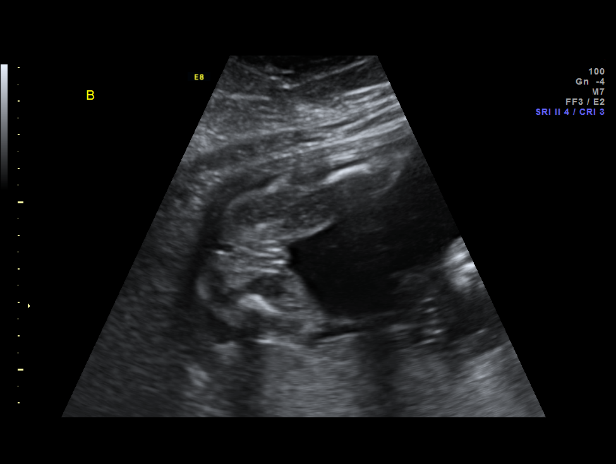
[im 55/55]
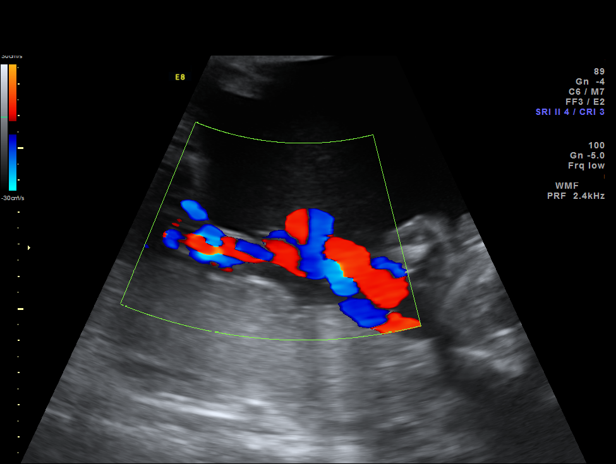

[15 of 28 positions shown; findings below may reference images not displayed]

OBSTETRICS REPORT
                      (Signed Final 06/22/2013 [DATE])

Service(s) Provided

 US OB FOLLOW UP                                       76816.1
 US OB FOLLOW UP ADDL GEST                             76816.2
Indications

 Twin gestation, Mo-Mo
Fetal Evaluation (Fetus A)

 Num Of Fetuses:    2
 Fetal Heart Rate:  144                          bpm
 Cardiac Activity:  Observed
 Presentation:      Cephalic
 Placenta:          Posterior, above cervical
                    os
 P. Cord            Visualized
 Insertion:

 Amniotic Fluid
 AFI FV:      Subjectively within normal limits
                                             Larg Pckt:     6.4  cm
Biometry (Fetus A)

 BPD:     53.9  mm     G. Age:  22w 3d                CI:         78.3   70 - 86
 OFD:     68.8  mm                                    FL/HC:      20.0   18.4 -

 HC:     197.9  mm     G. Age:  22w 0d       31  %    HC/AC:      1.10   1.06 -

 AC:     179.4  mm     G. Age:  22w 6d       64  %    FL/BPD:     73.5   71 - 87
 FL:      39.6  mm     G. Age:  22w 5d       60  %    FL/AC:      22.1   20 - 24

 Est. FW:     526  gm      1 lb 3 oz     56  %     FW Discordancy      0 \ 0 %
Gestational Age (Fetus A)

 LMP:           22w 1d        Date:  01/18/13                 EDD:   10/25/13
 U/S Today:     22w 4d                                        EDD:   10/22/13
 Best:          22w 1d     Det. By:  LMP  (01/18/13)          EDD:   10/25/13
Anatomy (Fetus A)

 Cranium:          Appears normal         Aortic Arch:      Previously seen
 Fetal Cavum:      Previously seen        Ductal Arch:      Previously seen
 Ventricles:       Appears normal         Diaphragm:        Previously seen
 Choroid Plexus:   Previously seen        Stomach:          Appears normal, left
                                                            sided
 Cerebellum:       Previously seen        Abdomen:          Appears normal
 Posterior Fossa:  Previously seen        Abdominal Wall:   Previously seen
 Nuchal Fold:      Previously seen        Cord Vessels:     Previously seen
 Face:             Not well visualized    Kidneys:          Appear normal
 Lips:             Appears normal         Bladder:          Appears normal
 Heart:            Appears normal         Spine:            Previously seen
                   (4CH, axis, and
                   situs)
 RVOT:             Previously seen        Lower             Previously seen
                                          Extremities:
 LVOT:             Previously seen        Upper             Previously seen
                                          Extremities:

 Other:  Heels previously visualized.

Fetal Evaluation (Fetus B)

 Num Of Fetuses:    2
 Fetal Heart Rate:  153                          bpm
 Cardiac Activity:  Observed
 Presentation:      Transverse, head to
                    maternal right
 Placenta:          Posterior, above cervical
                    os
 P. Cord            Visualized
 Insertion:

 Amniotic Fluid
 AFI FV:      Subjectively within normal limits
                                             Larg Pckt:     6.4  cm
Biometry (Fetus B)

 BPD:     54.1  mm     G. Age:  22w 3d                CI:         77.1   70 - 86
 OFD:     70.2  mm                                    FL/HC:      19.3   18.4 -

 HC:       201  mm     G. Age:  22w 2d       43  %    HC/AC:      1.11   1.06 -

 AC:     181.6  mm     G. Age:  23w 0d       70  %    FL/BPD:     71.5   71 - 87
 FL:      38.7  mm     G. Age:  22w 3d       49  %    FL/AC:      21.3   20 - 24

 Est. FW:     525  gm      1 lb 3 oz     56  %     FW Discordancy         0  %
Gestational Age (Fetus B)

 LMP:           22w 1d        Date:  01/18/13                 EDD:   10/25/13
 U/S Today:     22w 4d                                        EDD:   10/22/13
 Best:          22w 1d     Det. By:  LMP  (01/18/13)          EDD:   10/25/13
Anatomy (Fetus B)

 Cranium:          Appears normal         Aortic Arch:      Previously seen
 Fetal Cavum:      Previously seen        Ductal Arch:      Previously seen
 Ventricles:       Appears normal         Diaphragm:        Previously seen
 Choroid Plexus:   Previously seen        Stomach:          Appears normal, left
                                                            sided
 Cerebellum:       Previously seen        Abdomen:          Appears normal
 Posterior Fossa:  Previously seen        Abdominal Wall:   Previously seen
 Nuchal Fold:      Previously seen        Cord Vessels:     Previously seen
 Face:             Orbits and profile     Kidneys:          Appear normal
                   previously seen
 Lips:             Previously seen        Bladder:          Appears normal
 Heart:            Appears normal         Spine:            Previously seen
                   (4CH, axis, and
                   situs)
 RVOT:             Previously seen        Lower             Previously seen
                                          Extremities:
 LVOT:             Previously seen        Upper             Previously seen
                                          Extremities:

 Other:  Fetus appears to be a female. Heels and 5th digit previously
         visualized.
Cervix Uterus Adnexa

 Cervical Length:    3.6      cm

 Cervix:       Normal appearance by transabdominal scan.
 Uterus:       No abnormality visualized.
 Cul De Sac:   No free fluid seen.
 Left Ovary:    Not visualized.
 Right Ovary:   Not visualized.

 Adnexa:     No abnormality visualized.
Impression

 MC/MA twin gestation with best dates of 22 [DATE] weeks
 Interval growth is concordant

 Twin A:
 Cephalic, maternal left
 Interval growth is appropriate (56th %tile)
 Normal stomach / bladder visualized

 Twin B:
 Transverse, fetal head to maternal right
 Interval growth is appropriate (56th %tile)
 Normal stomch / bladder visualized

 No significant cord entanglement noted
Recommendations

 Recommend follow up in 2 weeks to screen for TTTS
 Plan admission at 24 weeks for inpatient monitoring

 questions or concerns.

## 2015-03-02 ENCOUNTER — Inpatient Hospital Stay (HOSPITAL_COMMUNITY)
Admission: AD | Admit: 2015-03-02 | Discharge: 2015-03-02 | Disposition: A | Discharge status: Home / Self Care | Payer: No Typology Code available for payment source | Source: Ambulatory Visit | Attending: Obstetrics & Gynecology | Admitting: Obstetrics & Gynecology

## 2015-03-02 ENCOUNTER — Encounter (HOSPITAL_COMMUNITY): Payer: Self-pay | Admitting: Obstetrics and Gynecology

## 2015-03-02 DIAGNOSIS — Z3202 Encounter for pregnancy test, result negative: Secondary | ICD-10-CM | POA: Insufficient documentation

## 2015-03-02 DIAGNOSIS — R1010 Upper abdominal pain, unspecified: Secondary | ICD-10-CM | POA: Insufficient documentation

## 2015-03-02 DIAGNOSIS — N926 Irregular menstruation, unspecified: Secondary | ICD-10-CM

## 2015-03-02 LAB — URINE MICROSCOPIC-ADD ON

## 2015-03-02 LAB — URINALYSIS, ROUTINE W REFLEX MICROSCOPIC
Bilirubin Urine: NEGATIVE
GLUCOSE, UA: NEGATIVE mg/dL
Ketones, ur: NEGATIVE mg/dL
LEUKOCYTES UA: NEGATIVE
NITRITE: NEGATIVE
PH: 5.5 (ref 5.0–8.0)
Protein, ur: NEGATIVE mg/dL
Specific Gravity, Urine: >1.03 — ABNORMAL HIGH (ref 1.005–1.030)

## 2015-03-02 LAB — POCT PREGNANCY, URINE: Preg Test, Ur: NEGATIVE

## 2015-03-02 NOTE — MAU Note (Signed)
Pt reports she is late 3 days. Has had a tubal ligation and is worried she might be pregnant. Negative pregnancy test.Reports some back pain and sore breast.

## 2015-03-02 NOTE — MAU Provider Note (Signed)
History     CSN: [id number]  Arrival date and time: 03/02/15 1053   None     Chief Complaint  Patient presents with  . Possible Pregnancy   HPI   Jenny Yu is a 30 y.o. female G1P0 presenting to MAU with many issues. She has had irregular bleeding this month and wants to know what is causing her cycle to be irregular. Her bleeding has lasted longer than normal with this cycle.  She does not have a history of irregular menstrual cycles; her cycles are normally 17-23 days.    She has a history of tubal ligation with her last pregnancy, however is concerned she may have a tubal pregnancy. She has occasional upper right abominal pain and also complains of pain all over her back. These symptoms have been present for several weeks.   OB History    Gravida Para Term Preterm AB TAB SAB Ectopic Multiple Living   1         0      Past Medical History  Diagnosis Date  . Trichomonas infection   . [id number](478.2)     Past Surgical History  Procedure Laterality Date  . Eye muscle surgery    . Wisdom tooth extraction      No family history on file.  Social History  Substance Use Topics  . Smoking status: Never Smoker   . Smokeless tobacco: Never Used  . Alcohol Use: No    Allergies:  Allergies  Allergen Reactions  . Peanut-Containing Drug Products     Prescriptions prior to admission  Medication Sig Dispense Refill Last Dose  . Prenatal Vit-Fe Fumarate-FA (PRENATAL MULTIVITAMIN) TABS tablet Take 1 tablet by mouth daily at 12 noon.   Taking   Results for orders placed or performed during the hospital encounter of 03/02/15 (from the past 48 hour(s))  Urinalysis, Routine w reflex microscopic (not at [facility])     Status: Abnormal   Collection Time: 03/02/15 11:20 AM  Result Value Ref Range   Color, Urine YELLOW YELLOW   APPearance CLEAR CLEAR   Specific Gravity, Urine >1.030 (H) 1.005 - 1.030   pH 5.5 5.0 - 8.0   Glucose, UA NEGATIVE NEGATIVE mg/dL   Hgb urine  dipstick TRACE (A) NEGATIVE   Bilirubin Urine NEGATIVE NEGATIVE   Ketones, ur NEGATIVE NEGATIVE mg/dL   Protein, ur NEGATIVE NEGATIVE mg/dL   Nitrite NEGATIVE NEGATIVE   Leukocytes, UA NEGATIVE NEGATIVE  Urine microscopic-add on     Status: Abnormal   Collection Time: 03/02/15 11:20 AM  Result Value Ref Range   Squamous Epithelial / LPF 0-5 (A) NONE SEEN   WBC, UA 0-5 0 - 5 WBC/hpf   RBC / HPF 0-5 0 - 5 RBC/hpf   Bacteria, UA RARE (A) NONE SEEN  Pregnancy, urine POC     Status: None   Collection Time: 03/02/15 11:41 AM  Result Value Ref Range   Preg Test, Ur NEGATIVE NEGATIVE    Comment:        THE SENSITIVITY OF THIS METHODOLOGY IS >24 mIU/mL    Review of Systems  Constitutional: Negative for fever and chills.  Gastrointestinal: Negative for nausea and vomiting.  Genitourinary: Negative for dysuria, urgency, frequency and hematuria.   Physical Exam   Blood pressure 123/64, pulse 80, temperature 98.3 F (36.8 C), temperature source Oral, resp. rate 18, last menstrual period 02/04/2015, unknown if currently breastfeeding.  Physical Exam  Constitutional: She is oriented to person, place, and time.  She appears well-developed and well-nourished. No distress.  HENT:  Head: Normocephalic.  Eyes: Pupils are equal, round, and reactive to light.  Respiratory: Effort normal.  Musculoskeletal: Normal range of motion.  Neurological: She is alert and oriented to person, place, and time.  Skin: Skin is warm. She is not diaphoretic.  Psychiatric: Her behavior is normal.    MAU Course  Procedures  None  MDM  Patient declines STI testing today Negative fever Pregnancy test negative   Patient insisting something be done to determine why her period is abnormal. I discussed with her that this issue is best assessed with a GYN or PCP; that one visit in the emergency room does not warrant a work up for abnormal periods.  The upper abdominal pain is not likely a GYN issue and the  patient would need to follow up with a PCP if the pain continues.  The patient walked out during discussion; I was not able to examine the patient.   Assessment and Plan   A:  1. Encounter for pregnancy test with result negative   2. Abnormal menstrual cycle     P:  Discharge home in stable condition  Return to MAU for emergencies. Follow up with [facility] or [facility] for GYN care Go to [facility]gent [facility] or [facility] if symptoms worsen     Duane Lope, NP 03/02/2015 2:08 PM

## 2018-03-12 ENCOUNTER — Encounter: Payer: Self-pay | Admitting: Family Medicine

## 2018-03-12 ENCOUNTER — Ambulatory Visit: Payer: Managed Care, Other (non HMO) | Admitting: Family Medicine

## 2018-03-12 VITALS — BP 114/70 | HR 71 | Temp 98.4°F | Ht 64.0 in | Wt 221.0 lb

## 2018-03-12 DIAGNOSIS — Z7689 Persons encountering health services in other specified circumstances: Secondary | ICD-10-CM | POA: Diagnosis not present

## 2018-03-12 DIAGNOSIS — R42 Dizziness and giddiness: Secondary | ICD-10-CM

## 2018-03-12 DIAGNOSIS — E049 Nontoxic goiter, unspecified: Secondary | ICD-10-CM | POA: Diagnosis not present

## 2018-03-12 NOTE — Patient Instructions (Signed)
Goiter  A goiter is an enlarged thyroid gland. The thyroid is located in the lower front of the neck. It makes hormones that affect many body parts and systems, including the system that affects how quickly the body burns fuel for energy (metabolism). Most goiters are painless and are not a cause for concern. Some goiters can affect the way your thyroid makes thyroid hormones. Goiters and conditions that cause goiters can be treated, if necessary. What are the causes? Common causes of this condition include:  Lack (deficiency) of a mineral called iodine. The thyroid gland uses iodine to make thyroid hormones.  Diseases that attack healthy cells in the body (autoimmune diseases) and affect thyroid function, such as Graves' disease or Hashimoto's disease. These diseases may cause the body to produce too much thyroid hormone (hyperthyroidism) or too little of the hormone (hypothyroidism).  Conditions that cause inflammation of the thyroid (thyroiditis).  One or more small growths on the thyroid (nodular goiter). Other causes include:  Medical problems caused by abnormal genes that are passed from parent to child (genetic defects).  Thyroid injury or infection.  Tumors that may or may not be cancerous.  Pregnancy.  Certain medicines.  Exposure to radiation. In some cases, the cause may not be known. What increases the risk? This condition is more likely to develop in:  People who do not get enough iodine in their diet.  People who have a family history of goiter.  Women.  People who are older than age 40.  People who smoke tobacco.  People who have had exposure to radiation. What are the signs or symptoms? The main symptom of this condition is swelling in the lower, front part of the neck. This swelling can range from a very small bump to a large lump. Other symptoms may include:  A tight feeling in the throat.  A hoarse voice.  Coughing.  Wheezing.  Difficulty  swallowing or breathing.  Bulging veins in the neck.  Dizziness. When a goiter is the result of an overactive thyroid (hyperthyroidism), symptoms may also include:  Nervousness or restlessness.  Inability to tolerate heat.  Unexplained weight loss.  Diarrhea.  Change in the texture of hair or skin.  Changes in heartbeat, such as skipped beats, extra beats, or a rapid heart rate.  Loss of menstruation.  Shaky hands.  Increased appetite.  Sleep problems. When a goiter is the result of an underactive thyroid (hypothyroidism), symptoms may also include:  Feeling like you have no energy (lethargy).  Inability to tolerate cold.  Weight gain that is not explained by a change in diet or exercise habits.  Dry skin.  Coarse hair.  Irregular menstrual periods.  Constipation.  Sadness or depression.  Fatigue. In some cases, there may not be any symptoms and the thyroid hormone levels may be normal. How is this diagnosed? This condition may be diagnosed based on your symptoms, your medical history, and a physical exam. You may have tests, such as:  Blood tests to check thyroid function.  Imaging tests, such as: ? Ultrasound. ? CT scan. ? MRI. ? Thyroid scan.  Removal of a tissue sample (biopsy) of the goiter or any nodules. The sample will be tested to check for cancer. How is this treated? Treatment for this condition depends on the cause and your symptoms. Treatment may include:  Medicines to regulate thyroid hormone levels.  Anti-inflammatory medicines or steroid medicines, if the goiter is caused by inflammation.  Iodine supplements or changes to your   diet, if the goiter is caused by iodine deficiency.  Radioactive iodine treatment.  Surgery to remove your thyroid. In some cases, you may only need regular check-ups with your health care provider to monitor your condition, and you may not need treatment. Follow these instructions at home:  Follow  instructions from your health care provider about any changes to your diet.  Take over-the-counter and prescription medicines only as told by your health care provider. These include supplements.  Do not use any products that contain nicotine or tobacco, such as cigarettes and e-cigarettes. If you need help quitting, ask your health care provider.  Keep all follow-up visits as told by your health care provider. This is important. Contact a health care provider if:  Your symptoms do not get better with treatment.  You have nausea, vomiting, or diarrhea. Get help right away if:  You have sudden, unexplained confusion or other mental changes.  You have a fever.  You have chest pain.  You have trouble breathing or swallowing.  You suddenly become very weak.  You experience extreme restlessness.  You feel your heart racing. Summary  A goiter is an enlarged thyroid gland.  The thyroid gland is located in the lower front of the neck. It makes hormones that affect many body parts and systems, including the system that affects how quickly the body burns fuel for energy (metabolism).  The main symptom of this condition is swelling in the lower, front part of the neck. This swelling can range from a very small bump to a large lump.  Treatment for this condition depends on the cause and your symptoms. You may need medicines, supplements, or regular monitoring of your condition. This information is not intended to replace advice given to you by your health care provider. Make sure you discuss any questions you have with your health care provider. Document Released: 06/27/2009 Document Revised: 10/03/2016 Document Reviewed: 10/03/2016 Elsevier Interactive Patient Education  2019 [employer] Dizziness Dizziness is a common problem. It is a feeling of unsteadiness or light-headedness. You may feel like you are about to faint. Dizziness can lead to injury if you stumble or fall. Anyone can  become dizzy, but dizziness is more common in older adults. This condition can be caused by a number of things, including medicines, dehydration, or illness. Follow these instructions at home: Eating and drinking  Drink enough fluid to keep your urine clear or pale yellow. This helps to keep you from becoming dehydrated. Try to drink more clear fluids, such as water.  Do not drink alcohol.  Limit your caffeine intake if told to do so by your health care provider. Check ingredients and nutrition facts to see if a food or beverage contains caffeine.  Limit your salt (sodium) intake if told to do so by your health care provider. Check ingredients and nutrition facts to see if a food or beverage contains sodium. Activity  Avoid making quick movements. ? Rise slowly from chairs and steady yourself until you feel okay. ? In the morning, first sit up on the side of the bed. When you feel okay, stand slowly while you hold onto something until you know that your balance is fine.  If you need to stand in one place for a long time, move your legs often. Tighten and relax the muscles in your legs while you are standing.  Do not drive or use heavy machinery if you feel dizzy.  Avoid bending down if you feel dizzy. Place items  in your home so that they are easy for you to reach without leaning over. Lifestyle  Do not use any products that contain nicotine or tobacco, such as cigarettes and e-cigarettes. If you need help quitting, ask your health care provider.  Try to reduce your stress level by using methods such as yoga or meditation. Talk with your health care provider if you need help to manage your stress. General instructions  Watch your dizziness for any changes.  Take over-the-counter and prescription medicines only as told by your health care provider. Talk with your health care provider if you think that your dizziness is caused by a medicine that you are taking.  Tell a friend or a  family member that you are feeling dizzy. If he or she notices any changes in your behavior, have this person call your health care provider.  Keep all follow-up visits as told by your health care provider. This is important. Contact a health care provider if:  Your dizziness does not go away.  Your dizziness or light-headedness gets worse.  You feel nauseous.  You have reduced hearing.  You have new symptoms.  You are unsteady on your feet or you feel like the room is spinning. Get help right away if:  You vomit or have diarrhea and are unable to eat or drink anything.  You have problems talking, walking, swallowing, or using your arms, hands, or legs.  You feel generally weak.  You are not thinking clearly or you have trouble forming sentences. It may take a friend or family member to notice this.  You have chest pain, abdominal pain, shortness of breath, or sweating.  Your vision changes.  You have any bleeding.  You have a severe headache.  You have neck pain or a stiff neck.  You have a fever. These symptoms may represent a serious problem that is an emergency. Do not wait to see if the symptoms will go away. Get medical help right away. Call your local emergency services (911 in the [place] Do not drive yourself to the hospital. Summary  Dizziness is a feeling of unsteadiness or light-headedness. This condition can be caused by a number of things, including medicines, dehydration, or illness.  Anyone can become dizzy, but dizziness is more common in older adults.  Drink enough fluid to keep your urine clear or pale yellow. Do not drink alcohol.  Avoid making quick movements if you feel dizzy. Monitor your dizziness for any changes. This information is not intended to replace advice given to you by your health care provider. Make sure you discuss any questions you have with your health care provider. Document Released: 07/03/2000 Document Revised: 02/10/2016  Document Reviewed: 02/10/2016 Elsevier Interactive Patient Education  2019 [employer].

## 2018-03-12 NOTE — Progress Notes (Addendum)
Patient presents to clinic today to f/u on ongoing concerns and establish care.  SUBJECTIVE: PMH: Pt is a 33 yo female with no sig pmh.  Pt has not been seen by a provider in a while.  Dizziness: -occurs from time to time -endorses episode last Friday that lasted 11 hours -noted dizziness when bending over to get her kids out of their car seat -drinking 1 bottle of water per day -denies recent illness. -states in the past was told she had anemia and started on iron.  She later was told she didn't so she stopped taking it.  Allergies: NKDA  Social Hx: Pt is married.  Pt had twins.  Pt working as a Runner, broadcasting/film/video.  Pt denies EtOH, tobacco, and drug use.   Family Medical Hx: Mom-scoliosis, HTN Dad-DM PGM-arthritis, MD, HTN PGF-DM, CVA  Health Maintenance: PAP -- Dec 2019  Past Medical History:  Diagnosis Date  . Headache(784.0)   . Trichomonas infection     Past Surgical History:  Procedure Laterality Date  . EYE MUSCLE SURGERY    . WISDOM TOOTH EXTRACTION      No current outpatient medications on file prior to visit.   No current facility-administered medications on file prior to visit.     Allergies  Allergen Reactions  . Peanut-Containing Drug Products     Family History  Problem Relation Age of Onset  . Hypertension Mother   . Diabetes Father   . Diabetes Paternal Grandmother   . Arthritis Paternal Grandmother   . Hypertension Paternal Grandmother   . Diabetes Paternal Grandfather   . Stroke Paternal Grandfather     Social History   Socioeconomic History  . Marital status: Married    Spouse name: Not on file  . Number of children: Not on file  . Years of education: Not on file  . Highest education level: Not on file  Occupational History  . Not on file  Social Needs  . Financial resource strain: Not on file  . Food insecurity:    Worry: Not on file    Inability: Not on file  . Transportation needs:    Medical: Not on file    Non-medical:  Not on file  Tobacco Use  . Smoking status: Never Smoker  . Smokeless tobacco: Never Used  Substance and Sexual Activity  . Alcohol use: No  . Drug use: No  . Sexual activity: Yes    Birth control/protection: None    Comment: Last intercourse 01/30/13  Lifestyle  . Physical activity:    Days per week: Not on file    Minutes per session: Not on file  . Stress: Not on file  Relationships  . Social connections:    Talks on phone: Not on file    Gets together: Not on file    Attends religious service: Not on file    Active member of club or organization: Not on file    Attends meetings of clubs or organizations: Not on file    Relationship status: Not on file  . Intimate partner violence:    Fear of current or ex partner: Not on file    Emotionally abused: Not on file    Physically abused: Not on file    Forced sexual activity: Not on file  Other Topics Concern  . Not on file  Social History Narrative  . Not on file    ROS General: Denies fever, chills, night sweats, changes in weight, changes in appetite  +  dizziness HEENT: Denies headaches, ear pain, changes in vision, rhinorrhea, sore throat CV: Denies CP, palpitations, SOB, orthopnea Pulm: Denies SOB, cough, wheezing GI: Denies abdominal pain, nausea, vomiting, diarrhea, constipation GU: Denies dysuria, hematuria, frequency, vaginal discharge Msk: Denies muscle cramps, joint pains Neuro: Denies weakness, numbness, tingling Skin: Denies rashes, bruising Psych: Denies depression, anxiety, hallucinations  BP 114/70 (BP Location: Left Arm, Patient Position: Sitting, Cuff Size: Large)   Pulse 71   Temp 98.4 F (36.9 C) (Oral)   Ht 5\' 4"  (1.626 m)   Wt 221 lb (100.2 kg)   LMP 02/16/2018 (Exact Date)   SpO2 94%   BMI 37.93 kg/m   Physical Exam Gen. Pleasant, well developed, well-nourished, in NAD HEENT - [place]/AT, PERRL, no nystagmus, no scleral icterus, no nasal drainage, pharynx without erythema or exudate.  TMs  normal b/l. Thyromegaly noted, no masses. Lungs: no use of accessory muscles, CTAB, no wheezes, rales or rhonchi Cardiovascular: RRR, No r/g/m, no peripheral edema Neuro:  A&Ox3, CN II-XII intact, normal gait Skin:  Warm, dry, intact, no lesions  No results found for this or any previous visit (from the past 2160 hour(s)).  Assessment/Plan: Dizziness  -discussed increasing po intake of water. -given handout - Plan: Hemoglobin A1c, Basic metabolic panel, CBC (no diff)  Goiter -given handout  - Plan: TSH, T4, free -will order thyroid u/s if needed.  Encounter to establish care -We reviewed the PMH, PSH, FH, SH, Meds and Allergies. -We provided refills for any medications we will prescribe as needed. -We addressed current concerns per orders and patient instructions. -We have asked for records for pertinent exams, studies, vaccines and notes from previous providers. -We have advised patient to follow up per instructions below.  F/u prn  Abbe Amsterdam, MD

## 2018-03-13 LAB — TSH: TSH: 1.05 u[IU]/mL (ref 0.35–4.50)

## 2018-03-13 LAB — HEMOGLOBIN A1C: HEMOGLOBIN A1C: 5.6 % (ref 4.6–6.5)

## 2018-03-13 LAB — CBC
HEMATOCRIT: 33.2 % — AB (ref 36.0–46.0)
Hemoglobin: 9.9 g/dL — ABNORMAL LOW (ref 12.0–15.0)
MCHC: 29.8 g/dL — AB (ref 30.0–36.0)
MCV: 64.6 fl — ABNORMAL LOW (ref 78.0–100.0)
Platelets: 558 10*3/uL — ABNORMAL HIGH (ref 150.0–400.0)
RBC: 5.14 Mil/uL — ABNORMAL HIGH (ref 3.87–5.11)
RDW: 19.6 % — ABNORMAL HIGH (ref 11.5–15.5)
WBC: 13.1 10*3/uL — ABNORMAL HIGH (ref 4.0–10.5)

## 2018-03-13 LAB — BASIC METABOLIC PANEL
BUN: 9 mg/dL (ref 6–23)
CALCIUM: 9.7 mg/dL (ref 8.4–10.5)
CHLORIDE: 103 meq/L (ref 96–112)
CO2: 26 mEq/L (ref 19–32)
CREATININE: 0.63 mg/dL (ref 0.40–1.20)
GFR: 133.2 mL/min (ref >60.00)
GLUCOSE: 80 mg/dL (ref 70–99)
Potassium: 4.1 mEq/L (ref 3.5–5.1)
Sodium: 139 mEq/L (ref 135–145)

## 2018-03-13 LAB — T4, FREE: Free T4: 0.82 ng/dL (ref 0.60–1.60)

## 2018-03-17 ENCOUNTER — Telehealth: Payer: Self-pay | Admitting: Family Medicine

## 2018-03-17 DIAGNOSIS — E049 Nontoxic goiter, unspecified: Secondary | ICD-10-CM | POA: Insufficient documentation

## 2018-03-17 MED ORDER — FERROUS SULFATE 325 (65 FE) MG PO TABS: 325.0000 mg | ORAL_TABLET | Freq: Two times a day (BID) | ORAL | 3 refills | Status: DC

## 2018-03-17 NOTE — Telephone Encounter (Signed)
Patient called again to inquire about her test results.  Please advise and call patient as soon as possible to let her know.  CB# [phone]

## 2018-03-17 NOTE — Telephone Encounter (Signed)
Copied from CRM [id number]. Topic: General - Inquiry >> Mar 17, 2018  2:19 PM Maia Petties wrote: Reason for CRM: pt called to inquire on lab results from 03/12/2018. Please call [phone].

## 2018-03-19 NOTE — Telephone Encounter (Signed)
Pt was given her lab results

## 2018-04-20 ENCOUNTER — Encounter: Payer: Self-pay | Admitting: Family Medicine

## 2018-04-20 ENCOUNTER — Other Ambulatory Visit: Payer: Self-pay

## 2018-04-20 ENCOUNTER — Ambulatory Visit (INDEPENDENT_AMBULATORY_CARE_PROVIDER_SITE_OTHER): Payer: Managed Care, Other (non HMO) | Admitting: Family Medicine

## 2018-04-20 DIAGNOSIS — R42 Dizziness and giddiness: Secondary | ICD-10-CM

## 2018-04-20 DIAGNOSIS — D509 Iron deficiency anemia, unspecified: Secondary | ICD-10-CM

## 2018-04-20 DIAGNOSIS — R509 Fever, unspecified: Secondary | ICD-10-CM | POA: Diagnosis not present

## 2018-04-20 NOTE — Progress Notes (Signed)
Virtual Visit via Video Note  I connected with@ on 04/20/18 at 10:30 AM EDT by a video enabled telemedicine application and verified that I am speaking with the correct person using two identifiers.  Location patient: car Location provider:work office Persons participating in the virtual visit: patient, provider  I discussed the limitations of evaluation and management by telemedicine and the availability of in person appointments. The patient expressed understanding and agreed to proceed.   HPI: Pt with dizziness and fever Tmax 100.2 x 3 days.  Noted fever on Saturday.  Felt like she was going to pass out.  Took tylenol, rested all day.  Sunday had a fever after showering.  Today afebrile.  Pt notes "head spinning".  Drinking 2 bottles of water per day.   Since last OFV, states never started iron supplements nor miralax as caused constipation in the past.  Denies rhinorrhea, HAs, rhinorrhea, sore throat, facial pain/ pressure, cough, diarrhea, constipation, or sick contacts.    ROS: See pertinent positives and negatives per HPI.  Past Medical History:  Diagnosis Date  . Headache(784.0)   . Trichomonas infection     Past Surgical History:  Procedure Laterality Date  . EYE MUSCLE SURGERY    . WISDOM TOOTH EXTRACTION      Family History  Problem Relation Age of Onset  . Hypertension Mother   . Diabetes Father   . Diabetes Paternal Grandmother   . Arthritis Paternal Grandmother   . Hypertension Paternal Grandmother   . Diabetes Paternal Grandfather   . Stroke Paternal Grandfather    Social History   Socioeconomic History  . Marital status: Married    Spouse name: Not on file  . Number of children: Not on file  . Years of education: Not on file  . Highest education level: Not on file  Occupational History  . Not on file  Social Needs  . Financial resource strain: Not on file  . Food insecurity:    Worry: Not on file    Inability: Not on file  . Transportation needs:    Medical: Not on file    Non-medical: Not on file  Tobacco Use  . Smoking status: Never Smoker  . Smokeless tobacco: Never Used  Substance and Sexual Activity  . Alcohol use: No  . Drug use: No  . Sexual activity: Yes    Birth control/protection: None    Comment: Last intercourse 01/30/13  Lifestyle  . Physical activity:    Days per week: Not on file    Minutes per session: Not on file  . Stress: Not on file  Relationships  . Social connections:    Talks on phone: Not on file    Gets together: Not on file    Attends religious service: Not on file    Active member of club or organization: Not on file    Attends meetings of clubs or organizations: Not on file    Relationship status: Not on file  . Intimate partner violence:    Fear of current or ex partner: Not on file    Emotionally abused: Not on file    Physically abused: Not on file    Forced sexual activity: Not on file  Other Topics Concern  . Not on file  Social History Narrative  . Not on file    Current Outpatient Medications:  .  ferrous sulfate 325 (65 FE) MG tablet, Take 1 tablet (325 mg total) by mouth 2 (two) times daily with a meal.,  Disp: 60 tablet, Rfl: 3  EXAM:  VITALS per patient if applicable:  RR between 12-20   GENERAL: alert, oriented, appears well and in no acute distress  HEENT: Wearing glasses, atraumatic, conjunttiva clear, no obvious abnormalities on inspection of external nose and ears  NECK: normal movements of the head and neck  LUNGS: on inspection no signs of respiratory distress, breathing rate appears normal, no obvious gross SOB, gasping or wheezing  CV: no obvious cyanosis  MS: moves all visible extremities without noticeable abnormality  PSYCH/NEURO: pleasant and cooperative, no obvious depression or anxiety, speech and thought processing grossly intact  ASSESSMENT AND PLAN:  Discussed the following assessment and plan:  Fever, unspecified fever cause -likely 2/2 viral  etiology -advised to monitor for other symptoms given COVID 19 pandemic. -ok to take tylenol prn for fever, pain, or discomfort  Iron deficiency anemia, unspecified iron deficiency anemia type -pt advised to start ferrous sulfate and miralax -H/H 9.9/33.2 on 03/12/18 -also encouraged to increased intake of leafy greens and iron rich foods.  Dizziness -likely 2/2 anemia -pt advised to increase po intake of water  -pt strongly encouraged to pick up rx and start ferrous sulfate   I discussed the assessment and treatment plan with the patient. The patient was provided an opportunity to ask questions and all were answered. The patient agreed with the plan and demonstrated an understanding of the instructions.   The patient was advised to call back or seek an in-person evaluation if the symptoms worsen or if the condition fails to improve as anticipated.  I provided 11 minutes of non-face-to-face time during this encounter.   Deeann Saint, MD

## 2018-04-21 ENCOUNTER — Telehealth: Payer: Self-pay | Admitting: Family Medicine

## 2018-04-21 ENCOUNTER — Ambulatory Visit: Payer: Self-pay

## 2018-04-21 NOTE — Telephone Encounter (Signed)
Noted. FYI 

## 2018-04-21 NOTE — Telephone Encounter (Signed)
Pt c/o mild dizziness and vertigo. Pt c/o stomach issues. Pt stated it was not cramping or pain. Pt stated she was unable to describe they way her stomach feels. No diarrhea or vomiting.  Pt stated that she had episode of dizziness and was seen by her doctor who dx her as being anemic. Pt was prescribed iron to take but pt stated that she has not taken her iron until today. Pt stated she took her iron this morning. Pt is tolerating fluids but advised pt to increase to at least 6-8 glasses of fluids.  Pt stated that her symptoms are constant.  Care advice given to pt and appt made tomorrow morning. Pt advised to wait for confirmation from the office before coming in to her appt. Sending note to office for provider to review. Email and phone number in chart.               Reason for Disposition . [1] MILD dizziness (e.g., walking normally) AND [2] has been evaluated by physician for this  Answer Assessment - Initial Assessment Questions 1. DESCRIPTION: "Describe your dizziness."     Feels like going faint-all he time 2. LIGHTHEADED: "Do you feel lightheaded?" (e.g., somewhat faint, woozy, weak upon standing)     yes 3. VERTIGO: "Do you feel like either you or the room is spinning or tilting?" (i.e. vertigo)    Yes head spinning 4. SEVERITY: "How bad is it?"  "Do you feel like you are going to faint?" "Can you stand and walk?"   - MILD - walking normally   - MODERATE - interferes with normal activities (e.g., work, school)    - SEVERE - unable to stand, requires support to walk, feels like passing out now.      mild 5. ONSET:  "When did the dizziness begin?"     Saturday 6. AGGRAVATING FACTORS: "Does anything make it worse?" (e.g., standing, change in head position)     Sitting, standing occasionally feels occasionally off balance 7. HEART RATE: "Can you tell me your heart rate?" "How many beats in 15 seconds?"  (Note: not all patients can do this)       84 bpm 8. CAUSE: "What do  you think is causing the dizziness?"     Iron is low not taking the iron at this time due to constipation  Ut has since started taking iron 9. RECURRENT SYMPTOM: "Have you had dizziness before?" If so, ask: "When was the last time?" "What happened that time?"     Yes-February 2020-was seen in office-dx with anemia 10. OTHER SYMPTOMS: "Do you have any other symptoms?" (e.g., fever, chest pain, vomiting, diarrhea, bleeding)  stomach feels unusual 11. PREGNANCY: "Is there any chance you are pregnant?" "When was your last menstrual period?"       No- LMP: 04/08/18  Protocols used: Donnie Coffin

## 2018-04-21 NOTE — Telephone Encounter (Signed)
Copied from CRM [id number]. Topic: Quick Communication - See Telephone Encounter >> Apr 21, 2018  3:55 PM Lorrine Kin, NT wrote: CRM for notification. See Telephone encounter for: 04/21/18. Patient calling and states that a nurse was supposed to reach back out to her today and she has been waiting. Would like a call back.

## 2018-04-22 ENCOUNTER — Other Ambulatory Visit: Payer: Self-pay | Admitting: Family Medicine

## 2018-04-22 ENCOUNTER — Other Ambulatory Visit: Payer: Self-pay

## 2018-04-22 ENCOUNTER — Encounter: Payer: Self-pay | Admitting: Family Medicine

## 2018-04-22 ENCOUNTER — Ambulatory Visit (INDEPENDENT_AMBULATORY_CARE_PROVIDER_SITE_OTHER): Payer: Managed Care, Other (non HMO) | Admitting: Family Medicine

## 2018-04-22 DIAGNOSIS — R42 Dizziness and giddiness: Secondary | ICD-10-CM | POA: Diagnosis not present

## 2018-04-22 DIAGNOSIS — R109 Unspecified abdominal pain: Secondary | ICD-10-CM | POA: Diagnosis not present

## 2018-04-22 DIAGNOSIS — R1012 Left upper quadrant pain: Secondary | ICD-10-CM

## 2018-04-22 DIAGNOSIS — D509 Iron deficiency anemia, unspecified: Secondary | ICD-10-CM | POA: Diagnosis not present

## 2018-04-22 LAB — POCT URINALYSIS DIPSTICK
Bilirubin, UA: NEGATIVE
Blood, UA: NEGATIVE
Glucose, UA: NEGATIVE
Ketones, UA: NEGATIVE
Leukocytes, UA: NEGATIVE
Nitrite, UA: NEGATIVE
Odor: NEGATIVE
Protein, UA: POSITIVE — AB
Spec Grav, UA: 1.025 (ref 1.010–1.025)
Urobilinogen, UA: 0.2 E.U./dL
pH, UA: 6 (ref 5.0–8.0)

## 2018-04-22 LAB — CBC
HCT: 32.7 % — ABNORMAL LOW (ref 36.0–46.0)
Hemoglobin: 9.9 g/dL — ABNORMAL LOW (ref 12.0–15.0)
MCHC: 30.2 g/dL (ref 30.0–36.0)
MCV: 63.8 fl — ABNORMAL LOW (ref 78.0–100.0)
Platelets: 413 10*3/uL — ABNORMAL HIGH (ref 150.0–400.0)
RBC: 5.13 Mil/uL — ABNORMAL HIGH (ref 3.87–5.11)
RDW: 18.8 % — ABNORMAL HIGH (ref 11.5–15.5)
WBC: 5.8 10*3/uL (ref 4.0–10.5)

## 2018-04-22 LAB — BASIC METABOLIC PANEL
BUN: 8 mg/dL (ref 6–23)
CO2: 26 mEq/L (ref 19–32)
Calcium: 8.8 mg/dL (ref 8.4–10.5)
Chloride: 103 mEq/L (ref 96–112)
Creatinine, Ser: 0.57 mg/dL (ref 0.40–1.20)
GFR: 149.41 mL/min (ref >60.00)
Glucose, Bld: 103 mg/dL — ABNORMAL HIGH (ref 70–99)
Potassium: 3.9 mEq/L (ref 3.5–5.1)
Sodium: 138 mEq/L (ref 135–145)

## 2018-04-22 NOTE — Progress Notes (Signed)
Virtual Visit via Video Note  I connected with Jenny Yu on 04/22/18 at  9:30 AM EDT by a video enabled telemedicine application and verified that I am speaking with the correct person using two identifiers.  Location patient: car Location provider:home office Persons participating in the virtual visit: patient, provider  I discussed the limitations of evaluation and management by telemedicine and the availability of in person appointments. The patient expressed understanding and agreed to proceed.   HPI: Pt seen on Monday 3/30 for dizziness and fever.  Pt still dizzy and now with L side pain.  L side pain started Monday night, states it is continuous.  Pt took iron x 1 day and  miralax once.  Also with N since Monday.  Pt endorses urinary frequency but is unsure if that is 2/2 drinking more water.  Pt notes pain in L side is worse with standing or taking shower.  Not worse with picking up her kids.  Feels like the beginning of a cycle, not really a cramp.  The pain feels like it is maybe moving down.   Pt gives vague answers to questions.  Pt becomes upset and complains that she is being seen through video and not in person.  Pt feels like her pain is not being taking seriously.  Pt advised she is being asked questions in regards to her pain, but is giving short answers/acting like she can't be bothered to answer questions.  Pt also states she knows her body and typically if she gets L side pain it goes away after drinking water.  Pt also states she has been anemic all her life and does not feel it is causing her dizziness.  Pt advised that there could be several things contributing to her dizziness including her anemia.  ROS: See pertinent positives and negatives per HPI.  Past Medical History:  Diagnosis Date  . Headache(784.0)   . Trichomonas infection     Past Surgical History:  Procedure Laterality Date  . EYE MUSCLE SURGERY    . WISDOM TOOTH EXTRACTION      Family History   Problem Relation Age of Onset  . Hypertension Mother   . Diabetes Father   . Diabetes Paternal Grandmother   . Arthritis Paternal Grandmother   . Hypertension Paternal Grandmother   . Diabetes Paternal Grandfather   . Stroke Paternal Grandfather     SOCIAL HX:    Current Outpatient Medications:  .  ferrous sulfate 325 (65 FE) MG tablet, Take 1 tablet (325 mg total) by mouth 2 (two) times daily with a meal., Disp: 60 tablet, Rfl: 3  EXAM:  VITALS per patient if applicable:  RR between 12-20 bpm  GENERAL: alert, oriented, appears well and in no acute distress.  Pt sitting in car, stops holding the phone (provider seeing the ceiling of the car)  HEENT: atraumatic, conjunttiva clear, no obvious abnormalities on inspection of external nose and ears  NECK: normal movements of the head and neck  LUNGS: on inspection no signs of respiratory distress, breathing rate appears normal, no obvious gross SOB, gasping or wheezing  CV: no obvious cyanosis  MS: moves all visible extremities without noticeable abnormality  PSYCH/NEURO: pleasant and cooperative, no obvious depression or anxiety, speech and thought processing grossly intact  ASSESSMENT AND PLAN:  Discussed the following assessment and plan:  Left sided abdominal pain  -pt advised of possible causes including constipation, UTI, nephrolithiasis. - Plan: POCT urinalysis dipstick, Urine Culture, Basic metabolic panel  Iron deficiency anemia, unspecified iron deficiency anemia type  -Attempted to explain to pt how anemia can cause various symptoms including but not limited to dizziness and palpitations. -pt advised to continue taking iron supplements longer than one day. - Plan: CBC (no diff)  Dizziness -likely 2/2 anemia.  Also possibly due to UTI, nephrolithiasis, dehydration. -pt encouraged to increase po intake of fluids. -Discussed obtaining UA, UCx based on UA results, CBC, and BMP.  I discussed the assessment and  treatment plan with the patient. The patient was provided an opportunity to ask questions and all were answered. The patient agreed with the plan and demonstrated an understanding of the instructions.   The patient was advised to call back or seek an in-person evaluation if the symptoms worsen or if the condition fails to improve as anticipated.   Deeann Saint, MD

## 2018-04-22 NOTE — Telephone Encounter (Signed)
Spoke with pt advised pt that dr Salomon Fick will be on the Web Ex visit at 9.30 am, verbalized understanding

## 2018-04-23 LAB — URINE CULTURE
MICRO NUMBER:: 368607
SPECIMEN QUALITY:: ADEQUATE

## 2018-11-25 ENCOUNTER — Ambulatory Visit: Payer: Self-pay | Admitting: Medical

## 2019-04-10 ENCOUNTER — Other Ambulatory Visit: Payer: Self-pay | Admitting: Family Medicine

## 2019-04-22 ENCOUNTER — Ambulatory Visit: Payer: Managed Care, Other (non HMO) | Attending: Internal Medicine

## 2019-04-22 DIAGNOSIS — Z23 Encounter for immunization: Secondary | ICD-10-CM

## 2019-04-22 NOTE — Progress Notes (Signed)
   Covid-19 Vaccination Clinic  Name:  Jenny Yu    MRN: [id number] DOB: [birth year]  04/22/2019  [name] was observed post Covid-19 immunization for 15 minutes without incident. She was provided with Vaccine Information Sheet and instruction to access the V-Safe system.   [name] was instructed to call 911 with any severe reactions post vaccine: Marland Kitchen Difficulty breathing  . Swelling of face and throat  . A fast heartbeat  . A bad rash all over body  . Dizziness and weakness   Immunizations Administered    Name Date Dose VIS Date Route   Pfizer COVID-19 Vaccine 04/22/2019 12:33 PM 0.3 mL 01/01/2019 Intramuscular   Manufacturer: [employer], [employer]   Lot: [id number]   NDC: [address]-4      Covid-19 Vaccination Clinic  Name:  Jenny Yu    MRN: [id number] DOB: 15-Jan-1986  04/22/2019  [name] was observed post Covid-19 immunization for 15 minutes without incident. She was provided with Vaccine Information Sheet and instruction to access the V-Safe system.   [name] was instructed to call 911 with any severe reactions post vaccine: Marland Kitchen Difficulty breathing  . Swelling of face and throat  . A fast heartbeat  . A bad rash all over body  . Dizziness and weakness

## 2019-05-17 ENCOUNTER — Ambulatory Visit: Payer: Managed Care, Other (non HMO) | Attending: Internal Medicine

## 2019-05-17 DIAGNOSIS — Z23 Encounter for immunization: Secondary | ICD-10-CM

## 2019-05-17 NOTE — Progress Notes (Signed)
   Covid-19 Vaccination Clinic  Name:  Jenny Yu    MRN: [id number] DOB: 1985-12-26  05/17/2019  Ms. Jenny Yu was observed post Covid-19 immunization for 15 minutes without incident. She was provided with Vaccine Information Sheet and instruction to access the V-Safe system.   Ms. Jenny Yu was instructed to call 911 with any severe reactions post vaccine: Marland Kitchen Difficulty breathing  . Swelling of face and throat  . A fast heartbeat  . A bad rash all over body  . Dizziness and weakness   Immunizations Administered    Name Date Dose VIS Date Route   Pfizer COVID-19 Vaccine 05/17/2019 10:03 AM 0.3 mL 03/17/2018 Intramuscular   Manufacturer: [employer], [employer]   Lot: [id number]   NDC: [address]-3

## 2019-08-23 LAB — OB RESULTS CONSOLE RPR: RPR: NONREACTIVE

## 2019-08-23 LAB — OB RESULTS CONSOLE ANTIBODY SCREEN: Antibody Screen: NEGATIVE

## 2019-08-23 LAB — OB RESULTS CONSOLE HIV ANTIBODY (ROUTINE TESTING): HIV: NONREACTIVE

## 2019-08-23 LAB — OB RESULTS CONSOLE ABO/RH: RH Type: POSITIVE

## 2019-08-23 LAB — OB RESULTS CONSOLE HEPATITIS B SURFACE ANTIGEN: Hepatitis B Surface Ag: NEGATIVE

## 2019-08-23 LAB — OB RESULTS CONSOLE GC/CHLAMYDIA
Chlamydia: NEGATIVE
Gonorrhea: NEGATIVE

## 2019-08-23 LAB — OB RESULTS CONSOLE RUBELLA ANTIBODY, IGM: Rubella: IMMUNE

## 2019-09-21 ENCOUNTER — Ambulatory Visit (INDEPENDENT_AMBULATORY_CARE_PROVIDER_SITE_OTHER): Payer: Managed Care, Other (non HMO) | Admitting: *Deleted

## 2019-09-21 DIAGNOSIS — Z3480 Encounter for supervision of other normal pregnancy, unspecified trimester: Secondary | ICD-10-CM

## 2019-09-21 DIAGNOSIS — Z5320 Procedure and treatment not carried out because of patient's decision for unspecified reasons: Secondary | ICD-10-CM

## 2019-09-21 NOTE — Progress Notes (Signed)
   Patient interview was not continued after explaining the policy and procedures of  [facility]. Patient asked nurse when she will receive the next ultrasound. The recently had an ultrasound at a different office on 09/17/19. Advised patient she will get another ultrasound between 16-[redacted] weeks gestation. At that ultrasound would be for the anatomy scan. If the scan can't be completed, the [facility] department will schedule her another appointment. However, that would be the only ultra to be completed unless the provider deemed it medically necessary to have other ultrasounds. Patient had complicated pregnancy with twin girls, during last pregnancy. Pt reported being hospitalized from 24 weeks to [redacted] weeks gestation. Patient had a cesarean at [redacted] weeks gestation. Patient was also advised due to COVID, appoints are scheduled virtually and in-person. Patient decided to continue care with previous provider and will call clinic back for Gyn concerns after pregnancy.  Clovis Pu, RN

## 2019-12-10 ENCOUNTER — Inpatient Hospital Stay (HOSPITAL_COMMUNITY): Admit: 2019-12-10 | Payer: Managed Care, Other (non HMO) | Admitting: Obstetrics and Gynecology

## 2020-04-03 LAB — OB RESULTS CONSOLE GBS: GBS: NEGATIVE

## 2020-05-04 ENCOUNTER — Other Ambulatory Visit: Payer: Self-pay | Admitting: Obstetrics & Gynecology

## 2020-05-04 ENCOUNTER — Encounter (HOSPITAL_COMMUNITY): Payer: Self-pay | Admitting: *Deleted

## 2020-05-04 ENCOUNTER — Telehealth (HOSPITAL_COMMUNITY): Payer: Self-pay | Admitting: *Deleted

## 2020-05-04 NOTE — Telephone Encounter (Signed)
Preadmission screen  

## 2020-05-05 ENCOUNTER — Other Ambulatory Visit (HOSPITAL_COMMUNITY): Payer: Managed Care, Other (non HMO)

## 2020-05-05 ENCOUNTER — Other Ambulatory Visit (HOSPITAL_COMMUNITY)
Admission: RE | Admit: 2020-05-05 | Discharge: 2020-05-05 | Disposition: A | Discharge status: Home / Self Care | Payer: Managed Care, Other (non HMO) | Source: Ambulatory Visit | Attending: Obstetrics & Gynecology | Admitting: Obstetrics & Gynecology

## 2020-05-05 DIAGNOSIS — Z20822 Contact with and (suspected) exposure to covid-19: Secondary | ICD-10-CM | POA: Insufficient documentation

## 2020-05-05 DIAGNOSIS — Z01812 Encounter for preprocedural laboratory examination: Secondary | ICD-10-CM | POA: Insufficient documentation

## 2020-05-05 LAB — SARS CORONAVIRUS 2 (TAT 6-24 HRS): SARS Coronavirus 2: NEGATIVE

## 2020-05-07 ENCOUNTER — Inpatient Hospital Stay (HOSPITAL_COMMUNITY)
Admission: AD | Admit: 2020-05-07 | Discharge: 2020-05-10 | DRG: 787 | Disposition: A | Discharge status: Home / Self Care | Payer: Managed Care, Other (non HMO) | Attending: Obstetrics & Gynecology | Admitting: Obstetrics & Gynecology

## 2020-05-07 ENCOUNTER — Encounter (HOSPITAL_COMMUNITY): Payer: Self-pay | Admitting: Obstetrics & Gynecology

## 2020-05-07 ENCOUNTER — Inpatient Hospital Stay (HOSPITAL_COMMUNITY): Payer: Managed Care, Other (non HMO) | Admitting: Anesthesiology

## 2020-05-07 ENCOUNTER — Inpatient Hospital Stay (HOSPITAL_COMMUNITY): Payer: Managed Care, Other (non HMO)

## 2020-05-07 ENCOUNTER — Other Ambulatory Visit: Payer: Self-pay

## 2020-05-07 DIAGNOSIS — Z20822 Contact with and (suspected) exposure to covid-19: Secondary | ICD-10-CM | POA: Diagnosis present

## 2020-05-07 DIAGNOSIS — O480 Post-term pregnancy: Secondary | ICD-10-CM | POA: Diagnosis present

## 2020-05-07 DIAGNOSIS — Z3A41 41 weeks gestation of pregnancy: Secondary | ICD-10-CM | POA: Diagnosis not present

## 2020-05-07 DIAGNOSIS — D62 Acute posthemorrhagic anemia: Secondary | ICD-10-CM | POA: Diagnosis not present

## 2020-05-07 DIAGNOSIS — O34211 Maternal care for low transverse scar from previous cesarean delivery: Secondary | ICD-10-CM | POA: Diagnosis present

## 2020-05-07 DIAGNOSIS — O99892 Other specified diseases and conditions complicating childbirth: Secondary | ICD-10-CM | POA: Diagnosis present

## 2020-05-07 DIAGNOSIS — F419 Anxiety disorder, unspecified: Secondary | ICD-10-CM | POA: Diagnosis present

## 2020-05-07 DIAGNOSIS — O99214 Obesity complicating childbirth: Secondary | ICD-10-CM | POA: Diagnosis present

## 2020-05-07 DIAGNOSIS — O9081 Anemia of the puerperium: Secondary | ICD-10-CM | POA: Diagnosis not present

## 2020-05-07 DIAGNOSIS — Z3490 Encounter for supervision of normal pregnancy, unspecified, unspecified trimester: Secondary | ICD-10-CM | POA: Diagnosis present

## 2020-05-07 LAB — CBC
HCT: 39.7 % (ref 36.0–46.0)
Hemoglobin: 13.1 g/dL (ref 12.0–15.0)
MCH: 26.5 pg (ref 26.0–34.0)
MCHC: 33 g/dL (ref 30.0–36.0)
MCV: 80.4 fL (ref 80.0–100.0)
Platelets: 322 10*3/uL (ref 150–400)
RBC: 4.94 MIL/uL (ref 3.87–5.11)
RDW: 14.7 % (ref 11.5–15.5)
WBC: 9.9 10*3/uL (ref 4.0–10.5)
nRBC: 0 % (ref 0.0–0.2)

## 2020-05-07 LAB — TYPE AND SCREEN
ABO/RH(D): O POS
Antibody Screen: NEGATIVE

## 2020-05-07 LAB — RPR: RPR Ser Ql: NONREACTIVE

## 2020-05-07 MED ORDER — FENTANYL-BUPIVACAINE-NACL 0.5-0.125-0.9 MG/250ML-% EP SOLN
EPIDURAL | Status: DC | PRN
  Administered 2020-05-07: 12 mL/h via EPIDURAL

## 2020-05-07 MED ORDER — DIPHENHYDRAMINE HCL 50 MG/ML IJ SOLN: 12.5000 mg | INTRAMUSCULAR | Status: DC | PRN

## 2020-05-07 MED ORDER — PHENYLEPHRINE 40 MCG/ML (10ML) SYRINGE FOR IV PUSH (FOR BLOOD PRESSURE SUPPORT): 80.0000 ug | PREFILLED_SYRINGE | INTRAVENOUS | Status: DC | PRN

## 2020-05-07 MED ORDER — ONDANSETRON HCL 4 MG/2ML IJ SOLN: 4.0000 mg | Freq: Four times a day (QID) | INTRAMUSCULAR | Status: DC | PRN

## 2020-05-07 MED ORDER — OXYTOCIN-SODIUM CHLORIDE 30-0.9 UT/500ML-% IV SOLN
1.0000 m[IU]/min | INTRAVENOUS | Status: DC
  Filled 2020-05-07: qty 500

## 2020-05-07 MED ORDER — SOD CITRATE-CITRIC ACID 500-334 MG/5ML PO SOLN
30.0000 mL | ORAL | Status: DC | PRN
  Administered 2020-05-08: 30 mL via ORAL
  Filled 2020-05-07: qty 15

## 2020-05-07 MED ORDER — TERBUTALINE SULFATE 1 MG/ML IJ SOLN: 0.2500 mg | Freq: Once | INTRAMUSCULAR | Status: DC | PRN

## 2020-05-07 MED ORDER — PHENYLEPHRINE 40 MCG/ML (10ML) SYRINGE FOR IV PUSH (FOR BLOOD PRESSURE SUPPORT)
80.0000 ug | PREFILLED_SYRINGE | INTRAVENOUS | Status: DC | PRN
  Administered 2020-05-07: 80 ug via INTRAVENOUS
  Filled 2020-05-07: qty 10

## 2020-05-07 MED ORDER — LIDOCAINE HCL (PF) 1 % IJ SOLN: 30.0000 mL | INTRAMUSCULAR | Status: DC | PRN

## 2020-05-07 MED ORDER — LIDOCAINE HCL (PF) 1 % IJ SOLN
INTRAMUSCULAR | Status: DC | PRN
  Administered 2020-05-07: 2 mL via EPIDURAL
  Administered 2020-05-07: 10 mL via EPIDURAL

## 2020-05-07 MED ORDER — LACTATED RINGERS IV SOLN
500.0000 mL | Freq: Once | INTRAVENOUS | Status: AC
  Administered 2020-05-07: 500 mL via INTRAVENOUS

## 2020-05-07 MED ORDER — ACETAMINOPHEN 325 MG PO TABS: 650.0000 mg | ORAL_TABLET | ORAL | Status: DC | PRN

## 2020-05-07 MED ORDER — LACTATED RINGERS IV SOLN: INTRAVENOUS | Status: DC

## 2020-05-07 MED ORDER — EPHEDRINE 5 MG/ML INJ: 10.0000 mg | INTRAVENOUS | Status: DC | PRN

## 2020-05-07 MED ORDER — OXYTOCIN-SODIUM CHLORIDE 30-0.9 UT/500ML-% IV SOLN
1.0000 m[IU]/min | INTRAVENOUS | Status: DC
Start: 2020-05-07 — End: 2020-05-07
  Administered 2020-05-07: 2 m[IU]/min via INTRAVENOUS

## 2020-05-07 MED ORDER — OXYTOCIN-SODIUM CHLORIDE 30-0.9 UT/500ML-% IV SOLN: 1.0000 m[IU]/min | INTRAVENOUS | Status: DC

## 2020-05-07 MED ORDER — OXYTOCIN BOLUS FROM INFUSION: 333.0000 mL | Freq: Once | INTRAVENOUS | Status: DC

## 2020-05-07 MED ORDER — LACTATED RINGERS IV SOLN: 500.0000 mL | INTRAVENOUS | Status: DC | PRN

## 2020-05-07 MED ORDER — CEFAZOLIN SODIUM-DEXTROSE 2-4 GM/100ML-% IV SOLN: 2.0000 g | INTRAVENOUS | Status: DC

## 2020-05-07 MED ORDER — OXYTOCIN-SODIUM CHLORIDE 30-0.9 UT/500ML-% IV SOLN
1.0000 m[IU]/min | INTRAVENOUS | Status: DC
Start: 2020-05-07 — End: 2020-05-07

## 2020-05-07 MED ORDER — POVIDONE-IODINE 10 % EX SWAB: 2.0000 "application " | Freq: Once | CUTANEOUS | Status: DC

## 2020-05-07 MED ORDER — FENTANYL-BUPIVACAINE-NACL 0.5-0.125-0.9 MG/250ML-% EP SOLN
12.0000 mL/h | EPIDURAL | Status: DC | PRN
  Filled 2020-05-07: qty 250

## 2020-05-07 MED ORDER — OXYTOCIN-SODIUM CHLORIDE 30-0.9 UT/500ML-% IV SOLN: 2.5000 [IU]/h | INTRAVENOUS | Status: DC

## 2020-05-07 NOTE — Progress Notes (Signed)
Jenny Yu is a 35 y.o. G2P0101 at [redacted]w[redacted]d by ultrasound admitted for induction of labor due to Post dates, 41 wks.  Cervical foley expelled, pitocin ongoing. S/p epidural, hypotension, transient, corrected.   Subjective: No pain/ HA/ SOB./ CP/ any complaints   Objective: BP 123/69   Pulse 90   Temp 98.6 F (37 C) (Oral)   Resp 16   Ht 5\' 4"  (1.626 m)   Wt 108.3 kg   LMP 07/25/2019   SpO2 100%   BMI 40.97 kg/m   FHT: Baseline change from 160s to180s bpm. Variability:minimal to moderate,  Intermittent late decels since 10.30 pm, now recurrentm pitocin turned off at 11.15 pm- Cat II  UC:   irregular, every 3-5 minutes, difficult to pick up SVE: Cx unchanged at 3.5 cm, head above pelvic inlet . No active bleeding   Assessment / Plan: 35 yo at 41 wks, induction for TOLAC. Latent labor. Failed induction/ failure to dilate and now fetal intolerance to labor.  Recommend repeat C-section. Pt agrees.  Risks/complications of surgery reviewed incl infection, bleeding, damage to internal organs including bladder, bowels, ureters, blood vessels, other risks from anesthesia, VTE and delayed complications of any surgery, complications in future surgery reviewed. Also discussed neonatal complications incl difficult delivery, laceration, vacuum assistance, TTN etc. Pt understands and agrees, all concerns addressed.     32 05/07/2020, 11:49 PM

## 2020-05-07 NOTE — Anesthesia Preprocedure Evaluation (Addendum)
Anesthesia Evaluation  Patient identified by MRN, date of birth, ID band Patient awake    Reviewed: Allergy & Precautions, Patient's Chart, lab work & pertinent test results  Airway Mallampati: II  TM Distance: >3 FB Neck ROM: Full    Dental no notable dental hx.    Pulmonary neg pulmonary ROS,    Pulmonary exam normal breath sounds clear to auscultation       Cardiovascular negative cardio ROS Normal cardiovascular exam Rhythm:Regular Rate:Normal     Neuro/Psych  Headaches, negative psych ROS   GI/Hepatic negative GI ROS, Neg liver ROS,   Endo/Other  Morbid obesityBMI 41  Renal/GU negative Renal ROS  negative genitourinary   Musculoskeletal negative musculoskeletal ROS (+)   Abdominal (+) + obese,   Peds negative pediatric ROS (+)  Hematology negative hematology ROS (+) hct 39.7, plt 322   Anesthesia Other Findings   Reproductive/Obstetrics (+) Pregnancy TOLAC, previous section under GA for twins 2015                             Anesthesia Physical Anesthesia Plan  ASA: III and emergent  Anesthesia Plan: Epidural   Post-op Pain Management:    Induction:   PONV Risk Score and Plan: 2  Airway Management Planned: Natural Airway  Additional Equipment: None  Intra-op Plan:   Post-operative Plan:   Informed Consent: I have reviewed the patients History and Physical, chart, labs and discussed the procedure including the risks, benefits and alternatives for the proposed anesthesia with the patient or authorized representative who has indicated his/her understanding and acceptance.       Plan Discussed with:   Anesthesia Plan Comments:        Anesthesia Quick Evaluation

## 2020-05-07 NOTE — Progress Notes (Signed)
Labor Progress Note  S/O: Pt resting comfortably with husband at bedside providing support. Has had some BH ctxs over the weekend but non-painful and irregular. Ready to start IOL process  SVE: 1/50/-2 soft mid position  EFM: cat I baseline 150 bpm mod var +accels, -decels Toco: uterine irritability  A/P: 34Y G2P0102 @ 41.0 IOL LT TOLAC  -Pt has been previously consented regarding r/b/a for TOLAC including risk of emergency repeat cesarean section. Declined scheduled repeat c/s  -We reviewed risks/benefits of cervical Foley balloon, pt agrees to placement. Foley balloon placed without difficulties and well tolerated by pt. Balloon filled to 60 cc -Plan to add Pitocin augmentation -Ok for light labor diet once prior to starting Pitocin -Cont EFM/Toco: cat I tracing -GBS NEG -IV/Epidural pain regimen as requested -Routine intrapartum/TOLAC care  Jenny Yu A Jenny Yu 05/07/20 10:38 AM

## 2020-05-07 NOTE — Progress Notes (Signed)
Jenny Yu is a 35 y.o. G2P0101 at [redacted]w[redacted]d by ultrasound admitted for induction of labor due to Post dates, 41 wks .  Subjective: No complaints   Objective: BP (!) 147/86   Pulse 82   Temp 98.8 F (37.1 C) (Oral)   Resp 16   Ht 5\' 4"  (1.626 m)   Wt 108.3 kg   LMP 07/25/2019   BMI 40.97 kg/m   FHT: 150s bpm, variability: moderate,  accelerations:  Present,  decelerations:  Absent Cat I  UC:   irregular, every 3-5 minutes, pitocin at 6 mu SVE:   Dilation: 3.5 Effacement (%): 50 Station: -3 Exam by:: Aliviana Burdell MD AROM clear. Head above inlet.   Labs: Lab Results  Component Value Date   WBC 9.9 05/07/2020   HGB 13.1 05/07/2020   HCT 39.7 05/07/2020   MCV 80.4 05/07/2020   PLT 322 05/07/2020    Assessment / Plan: 35 yo at 41 wks, prior LTCS, here for TOLAC. Early labor. Continue pitocin 1x1 and ambulate/ use ball and assess for descent and dilatation.   EFW 7.1/2 lbs MOD : 32 05/07/2020, 7:59 PM

## 2020-05-07 NOTE — H&P (Signed)
Jenny Yu is a 35 y.o. female presenting for IOL at 41 wks for dates, pt is for TOLAC, she declined scheduled elective C/section.  [id number], H/o 30 wk LTCS (2 layer closure) for Momo-Mono twins in Jan'15. She had tubal ligation with that and this pregnancy is after successful tubal reversal, 1 month after surgery.  PNCare from 6 wks, maternal obesity, 21 lb weight gain, otherwise uncomplicated pregnancy. Growth sono -34 wks 5'13" 63%, AC 54% AFI 11 cm Vx. NST/AFI nl at 40.4 wks,  GBS(-) on 04/03/2020  Ob History: G2P0102- twin girls. LTCS -preterm GynHx- Married, remote trich, no G/C/T now. Nl Paps. No breast issues. Plans to breast feed.   Past Medical History:  Diagnosis Date  . Headache(784.0)   . Trichomonas infection    Past Surgical History:  Procedure Laterality Date  . CESAREAN SECTION    . EYE MUSCLE SURGERY    . WISDOM TOOTH EXTRACTION     Family History: family history includes Arthritis in her paternal grandmother; Diabetes in her father, paternal grandfather, and paternal grandmother; Heart disease in her paternal grandmother; Hypertension in her mother and paternal grandmother; Stroke in her paternal grandfather. Social History:  reports that she has never smoked. She has never used smokeless tobacco. She reports that she does not drink alcohol and does not use drugs.     Maternal Diabetes: No Genetic Screening: Normal Maternal Ultrasounds/Referrals: Normal Fetal Ultrasounds or other Referrals:  None Maternal Substance Abuse:  No Significant Maternal Medications:  None Significant Maternal Lab Results:  Group B Strep negative Other Comments:  TOLAC  Review of Systems History   Last menstrual period 07/25/2019, unknown if currently breastfeeding. Exam Physical Exam  LMP 07/25/2019   A&O x 3, no acute distress. Pleasant HEENT neg, no thyromegaly Lungs CTA bilat CV RRR, S1S2 normal Abdo soft, non tender, non acute Extr no edema/ tenderness Pelvic Cx was  closed but soft and stn -4 at last office visit FHT  150s Minimal variability/ + accels/ no decels - cat I Toco none   Prenatal labs: ABO, Rh: O/Positive/-- (08/02 0000) Antibody: Negative (08/02 0000) Rubella: Immune (08/02 0000) RPR: Nonreactive (08/02 0000)  HBsAg: Negative (08/02 0000)  HIV: Non-reactive (08/02 0000)  GBS:   Negative (04/03/2020)  QUAD neg   Assessment/Plan: 36 yo G2P0102, 41 wks, IOL for dates, TOLAC planned with low dose pitocin and cervical foley. EFW 7.1/2- 8 lbs TOLAC counseling done in office, risks of scar rupture, fetal maternal morbidity, small risk of death d/w pt, patient can sign hospital TOLAC consent. Risk of repeat C-section d/w pt. Pt understands and agrees.    Robley Fries 05/07/2020, 9:37 AM

## 2020-05-07 NOTE — Anesthesia Procedure Notes (Signed)
Epidural Patient location during procedure: OB Start time: 05/07/2020 10:10 PM End time: 05/07/2020 10:19 PM  Staffing Anesthesiologist: Lannie Fields, DO Performed: anesthesiologist   Preanesthetic Checklist Completed: patient identified, IV checked, risks and benefits discussed, monitors and equipment checked, pre-op evaluation and timeout performed  Epidural Patient position: sitting Prep: DuraPrep and site prepped and draped Patient monitoring: continuous pulse ox, blood pressure, heart rate and cardiac monitor Approach: midline Location: L3-L4 Injection technique: LOR air  Needle:  Needle type: Tuohy  Needle gauge: 17 G Needle length: 9 cm Needle insertion depth: 7 cm Catheter type: closed end flexible Catheter size: 19 Gauge Catheter at skin depth: 12 cm Test dose: negative  Assessment Sensory level: T8 Events: blood not aspirated, injection not painful, no injection resistance, no paresthesia and negative IV test  Additional Notes Patient identified. Risks/Benefits/Options discussed with patient including but not limited to bleeding, infection, nerve damage, paralysis, failed block, incomplete pain control, headache, blood pressure changes, nausea, vomiting, reactions to medication both or allergic, itching and postpartum back pain. Confirmed with bedside nurse the patient's most recent platelet count. Confirmed with patient that they are not currently taking any anticoagulation, have any bleeding history or any family history of bleeding disorders. Patient expressed understanding and wished to proceed. All questions were answered. Sterile technique was used throughout the entire procedure. Please see nursing notes for vital signs. Test dose was given through epidural catheter and negative prior to continuing to dose epidural or start infusion. Warning signs of high block given to the patient including shortness of breath, tingling/numbness in hands, complete motor  block, or any concerning symptoms with instructions to call for help. Patient was given instructions on fall risk and not to get out of bed. All questions and concerns addressed with instructions to call with any issues or inadequate analgesia.  Reason for block:procedure for pain

## 2020-05-08 ENCOUNTER — Encounter (HOSPITAL_COMMUNITY)
Admission: AD | Disposition: A | Discharge status: Home / Self Care | Payer: Self-pay | Source: Home / Self Care | Attending: Obstetrics & Gynecology

## 2020-05-08 ENCOUNTER — Encounter (HOSPITAL_COMMUNITY): Payer: Self-pay | Admitting: Obstetrics & Gynecology

## 2020-05-08 DIAGNOSIS — D62 Acute posthemorrhagic anemia: Secondary | ICD-10-CM | POA: Diagnosis not present

## 2020-05-08 DIAGNOSIS — O99892 Other specified diseases and conditions complicating childbirth: Secondary | ICD-10-CM | POA: Diagnosis present

## 2020-05-08 LAB — COMPREHENSIVE METABOLIC PANEL
ALT: UNDETERMINED U/L (ref 0–44)
ALT: 16 U/L (ref 0–44)
AST: UNDETERMINED U/L (ref 15–41)
AST: 20 U/L (ref 15–41)
Albumin: 2.1 g/dL — ABNORMAL LOW (ref 3.5–5.0)
Albumin: 2 g/dL — ABNORMAL LOW (ref 3.5–5.0)
Alkaline Phosphatase: 107 U/L (ref 38–126)
Alkaline Phosphatase: 103 U/L (ref 38–126)
Anion gap: 11 (ref 5–15)
Anion gap: 7 (ref 5–15)
BUN: 5 mg/dL — ABNORMAL LOW (ref 6–20)
BUN: <5 mg/dL — ABNORMAL LOW (ref 6–20)
CO2: 16 mmol/L — ABNORMAL LOW (ref 22–32)
CO2: 24 mmol/L (ref 22–32)
Calcium: 8.6 mg/dL — ABNORMAL LOW (ref 8.9–10.3)
Calcium: 8.5 mg/dL — ABNORMAL LOW (ref 8.9–10.3)
Chloride: 112 mmol/L — ABNORMAL HIGH (ref 98–111)
Chloride: 106 mmol/L (ref 98–111)
Creatinine, Ser: 0.61 mg/dL (ref 0.44–1.00)
Creatinine, Ser: 0.68 mg/dL (ref 0.44–1.00)
GFR, Estimated: >60 mL/min (ref >60)
GFR, Estimated: >60 mL/min (ref >60)
Glucose, Bld: 96 mg/dL (ref 70–99)
Glucose, Bld: 121 mg/dL — ABNORMAL HIGH (ref 70–99)
Potassium: 6.3 mmol/L (ref 3.5–5.1)
Potassium: 4 mmol/L (ref 3.5–5.1)
Sodium: 139 mmol/L (ref 135–145)
Sodium: 137 mmol/L (ref 135–145)
Total Bilirubin: UNDETERMINED mg/dL (ref 0.3–1.2)
Total Bilirubin: 0.3 mg/dL (ref 0.3–1.2)
Total Protein: 4.8 g/dL — ABNORMAL LOW (ref 6.5–8.1)
Total Protein: 4.8 g/dL — ABNORMAL LOW (ref 6.5–8.1)

## 2020-05-08 LAB — CBC
HCT: 39 % (ref 36.0–46.0)
HCT: 30.7 % — ABNORMAL LOW (ref 36.0–46.0)
Hemoglobin: 12.7 g/dL (ref 12.0–15.0)
Hemoglobin: 10.2 g/dL — ABNORMAL LOW (ref 12.0–15.0)
MCH: 26.7 pg (ref 26.0–34.0)
MCH: 27.3 pg (ref 26.0–34.0)
MCHC: 32.6 g/dL (ref 30.0–36.0)
MCHC: 33.2 g/dL (ref 30.0–36.0)
MCV: 82.1 fL (ref 80.0–100.0)
MCV: 82.3 fL (ref 80.0–100.0)
Platelets: 316 10*3/uL (ref 150–400)
Platelets: 284 10*3/uL (ref 150–400)
RBC: 4.75 MIL/uL (ref 3.87–5.11)
RBC: 3.73 MIL/uL — ABNORMAL LOW (ref 3.87–5.11)
RDW: 14.7 % (ref 11.5–15.5)
RDW: 14.8 % (ref 11.5–15.5)
WBC: 19.6 10*3/uL — ABNORMAL HIGH (ref 4.0–10.5)
WBC: 17 10*3/uL — ABNORMAL HIGH (ref 4.0–10.5)
nRBC: 0 % (ref 0.0–0.2)
nRBC: 0.1 % (ref 0.0–0.2)

## 2020-05-08 SURGERY — Surgical Case
Anesthesia: Epidural | Wound class: Clean Contaminated

## 2020-05-08 MED ORDER — FENTANYL CITRATE (PF) 100 MCG/2ML IJ SOLN
INTRAMUSCULAR | Status: AC
  Administered 2020-05-08: 100 ug via INTRAVENOUS
  Filled 2020-05-08: qty 2

## 2020-05-08 MED ORDER — ZOLPIDEM TARTRATE 5 MG PO TABS
5.0000 mg | ORAL_TABLET | Freq: Every evening | ORAL | Status: DC | PRN
Start: 2020-05-08 — End: 2020-05-10

## 2020-05-08 MED ORDER — MEPERIDINE HCL 25 MG/ML IJ SOLN: 6.2500 mg | INTRAMUSCULAR | Status: DC | PRN

## 2020-05-08 MED ORDER — MISOPROSTOL 200 MCG PO TABS
ORAL_TABLET | ORAL | Status: AC
  Filled 2020-05-08: qty 5

## 2020-05-08 MED ORDER — OXYCODONE HCL 5 MG PO TABS: 5.0000 mg | ORAL_TABLET | ORAL | Status: DC | PRN

## 2020-05-08 MED ORDER — SENNOSIDES-DOCUSATE SODIUM 8.6-50 MG PO TABS: 2.0000 | ORAL_TABLET | Freq: Every day | ORAL | Status: DC

## 2020-05-08 MED ORDER — SCOPOLAMINE 1 MG/3DAYS TD PT72
1.0000 | MEDICATED_PATCH | Freq: Once | TRANSDERMAL | Status: DC
  Filled 2020-05-08: qty 1

## 2020-05-08 MED ORDER — SIMETHICONE 80 MG PO CHEW
80.0000 mg | CHEWABLE_TABLET | Freq: Three times a day (TID) | ORAL | Status: DC
  Administered 2020-05-08 – 2020-05-09 (×3): 80 mg via ORAL
  Filled 2020-05-08 (×4): qty 1

## 2020-05-08 MED ORDER — OXYTOCIN-SODIUM CHLORIDE 30-0.9 UT/500ML-% IV SOLN
INTRAVENOUS | Status: AC
  Administered 2020-05-08: 2.5 [IU]/h via INTRAVENOUS
  Filled 2020-05-08: qty 500

## 2020-05-08 MED ORDER — SODIUM CHLORIDE 0.9 % IR SOLN
Status: DC | PRN
  Administered 2020-05-08: 1000 mL

## 2020-05-08 MED ORDER — NALBUPHINE HCL 10 MG/ML IJ SOLN
5.0000 mg | Freq: Once | INTRAMUSCULAR | Status: DC | PRN
Start: 2020-05-08 — End: 2020-05-10

## 2020-05-08 MED ORDER — DIBUCAINE (PERIANAL) 1 % EX OINT: 1.0000 "application " | TOPICAL_OINTMENT | CUTANEOUS | Status: DC | PRN

## 2020-05-08 MED ORDER — ONDANSETRON HCL 4 MG/2ML IJ SOLN: 4.0000 mg | Freq: Three times a day (TID) | INTRAMUSCULAR | Status: DC | PRN

## 2020-05-08 MED ORDER — LIDOCAINE-EPINEPHRINE (PF) 2 %-1:200000 IJ SOLN
INTRAMUSCULAR | Status: DC | PRN
  Administered 2020-05-08: 10 mL via EPIDURAL

## 2020-05-08 MED ORDER — ONDANSETRON HCL 4 MG/2ML IJ SOLN
INTRAMUSCULAR | Status: DC | PRN
  Administered 2020-05-08: 4 mg via INTRAVENOUS

## 2020-05-08 MED ORDER — NALBUPHINE HCL 10 MG/ML IJ SOLN: 5.0000 mg | INTRAMUSCULAR | Status: DC | PRN

## 2020-05-08 MED ORDER — OXYCODONE HCL 5 MG/5ML PO SOLN: 5.0000 mg | Freq: Once | ORAL | Status: DC | PRN

## 2020-05-08 MED ORDER — DIPHENHYDRAMINE HCL 25 MG PO CAPS: 25.0000 mg | ORAL_CAPSULE | Freq: Four times a day (QID) | ORAL | Status: DC | PRN

## 2020-05-08 MED ORDER — KETOROLAC TROMETHAMINE 30 MG/ML IJ SOLN
30.0000 mg | Freq: Four times a day (QID) | INTRAMUSCULAR | Status: AC
  Administered 2020-05-08 (×2): 30 mg via INTRAVENOUS
  Filled 2020-05-08 (×3): qty 1

## 2020-05-08 MED ORDER — MORPHINE SULFATE (PF) 0.5 MG/ML IJ SOLN
INTRAMUSCULAR | Status: AC
  Filled 2020-05-08: qty 10

## 2020-05-08 MED ORDER — IBUPROFEN 800 MG PO TABS
800.0000 mg | ORAL_TABLET | Freq: Four times a day (QID) | ORAL | Status: DC
  Administered 2020-05-09 – 2020-05-10 (×6): 800 mg via ORAL
  Filled 2020-05-08 (×7): qty 1

## 2020-05-08 MED ORDER — ONDANSETRON HCL 4 MG/2ML IJ SOLN
INTRAMUSCULAR | Status: AC
  Filled 2020-05-08: qty 2

## 2020-05-08 MED ORDER — DEXAMETHASONE SODIUM PHOSPHATE 4 MG/ML IJ SOLN
INTRAMUSCULAR | Status: DC | PRN
  Administered 2020-05-08: 4 mg via INTRAVENOUS

## 2020-05-08 MED ORDER — WITCH HAZEL-GLYCERIN EX PADS: 1.0000 "application " | MEDICATED_PAD | CUTANEOUS | Status: DC | PRN

## 2020-05-08 MED ORDER — FENTANYL CITRATE (PF) 100 MCG/2ML IJ SOLN
INTRAMUSCULAR | Status: DC | PRN
  Administered 2020-05-08: 100 ug via EPIDURAL

## 2020-05-08 MED ORDER — TETANUS-DIPHTH-ACELL PERTUSSIS 5-2.5-18.5 LF-MCG/0.5 IM SUSY: 0.5000 mL | PREFILLED_SYRINGE | Freq: Once | INTRAMUSCULAR | Status: DC

## 2020-05-08 MED ORDER — SCOPOLAMINE 1 MG/3DAYS TD PT72
MEDICATED_PATCH | TRANSDERMAL | Status: DC | PRN
  Administered 2020-05-08: 1 via TRANSDERMAL

## 2020-05-08 MED ORDER — MENTHOL 3 MG MT LOZG: 1.0000 | LOZENGE | OROMUCOSAL | Status: DC | PRN

## 2020-05-08 MED ORDER — NALOXONE HCL 0.4 MG/ML IJ SOLN: 0.4000 mg | INTRAMUSCULAR | Status: DC | PRN

## 2020-05-08 MED ORDER — KETOROLAC TROMETHAMINE 30 MG/ML IJ SOLN
30.0000 mg | Freq: Four times a day (QID) | INTRAMUSCULAR | Status: AC | PRN
  Administered 2020-05-08: 30 mg via INTRAVENOUS

## 2020-05-08 MED ORDER — MORPHINE SULFATE (PF) 0.5 MG/ML IJ SOLN
INTRAMUSCULAR | Status: DC | PRN
  Administered 2020-05-08: 3 mg via EPIDURAL

## 2020-05-08 MED ORDER — OXYTOCIN-SODIUM CHLORIDE 30-0.9 UT/500ML-% IV SOLN: 2.5000 [IU]/h | INTRAVENOUS | Status: AC

## 2020-05-08 MED ORDER — MEPERIDINE HCL 25 MG/ML IJ SOLN
INTRAMUSCULAR | Status: AC
  Filled 2020-05-08: qty 1

## 2020-05-08 MED ORDER — DEXAMETHASONE SODIUM PHOSPHATE 4 MG/ML IJ SOLN
INTRAMUSCULAR | Status: AC
  Filled 2020-05-08: qty 1

## 2020-05-08 MED ORDER — LACTATED RINGERS IV SOLN: INTRAVENOUS | Status: DC | PRN

## 2020-05-08 MED ORDER — SIMETHICONE 80 MG PO CHEW
80.0000 mg | CHEWABLE_TABLET | ORAL | Status: DC | PRN
Start: 2020-05-08 — End: 2020-05-10

## 2020-05-08 MED ORDER — SODIUM CHLORIDE 0.9% FLUSH: 3.0000 mL | INTRAVENOUS | Status: DC | PRN

## 2020-05-08 MED ORDER — NALBUPHINE HCL 10 MG/ML IJ SOLN: 5.0000 mg | Freq: Once | INTRAMUSCULAR | Status: DC | PRN

## 2020-05-08 MED ORDER — COCONUT OIL OIL: 1.0000 "application " | TOPICAL_OIL | Status: DC | PRN

## 2020-05-08 MED ORDER — METHYLERGONOVINE MALEATE 0.2 MG/ML IJ SOLN
INTRAMUSCULAR | Status: AC
  Filled 2020-05-08: qty 1

## 2020-05-08 MED ORDER — METHYLERGONOVINE MALEATE 0.2 MG/ML IJ SOLN
0.2000 mg | Freq: Once | INTRAMUSCULAR | Status: AC
  Administered 2020-05-08: 0.2 mg via INTRAMUSCULAR

## 2020-05-08 MED ORDER — KETOROLAC TROMETHAMINE 30 MG/ML IJ SOLN: 30.0000 mg | Freq: Four times a day (QID) | INTRAMUSCULAR | Status: AC | PRN

## 2020-05-08 MED ORDER — POLYSACCHARIDE IRON COMPLEX 150 MG PO CAPS
150.0000 mg | ORAL_CAPSULE | Freq: Every day | ORAL | Status: DC
  Administered 2020-05-08 – 2020-05-10 (×3): 150 mg via ORAL
  Filled 2020-05-08 (×3): qty 1

## 2020-05-08 MED ORDER — MAGNESIUM OXIDE 400 (241.3 MG) MG PO TABS
400.0000 mg | ORAL_TABLET | Freq: Every day | ORAL | Status: DC
  Administered 2020-05-08 – 2020-05-09 (×2): 400 mg via ORAL
  Filled 2020-05-08 (×2): qty 1

## 2020-05-08 MED ORDER — NALBUPHINE HCL 10 MG/ML IJ SOLN
5.0000 mg | INTRAMUSCULAR | Status: DC | PRN
Start: 2020-05-08 — End: 2020-05-10

## 2020-05-08 MED ORDER — DIPHENHYDRAMINE HCL 25 MG PO CAPS: 25.0000 mg | ORAL_CAPSULE | ORAL | Status: DC | PRN

## 2020-05-08 MED ORDER — DIPHENHYDRAMINE HCL 50 MG/ML IJ SOLN: 12.5000 mg | INTRAMUSCULAR | Status: DC | PRN

## 2020-05-08 MED ORDER — OXYTOCIN-SODIUM CHLORIDE 30-0.9 UT/500ML-% IV SOLN
INTRAVENOUS | Status: DC | PRN
  Administered 2020-05-08: 350 mL via INTRAVENOUS

## 2020-05-08 MED ORDER — METHYLERGONOVINE MALEATE 0.2 MG/ML IJ SOLN
INTRAMUSCULAR | Status: AC
  Administered 2020-05-08: 0.2 mg
  Filled 2020-05-08: qty 1

## 2020-05-08 MED ORDER — ACETAMINOPHEN 325 MG PO TABS
650.0000 mg | ORAL_TABLET | ORAL | Status: DC | PRN
  Administered 2020-05-09: 650 mg via ORAL
  Filled 2020-05-08: qty 2

## 2020-05-08 MED ORDER — PROMETHAZINE HCL 25 MG/ML IJ SOLN: 6.2500 mg | INTRAMUSCULAR | Status: DC | PRN

## 2020-05-08 MED ORDER — PRENATAL MULTIVITAMIN CH
1.0000 | ORAL_TABLET | Freq: Every day | ORAL | Status: DC
  Administered 2020-05-09: 1 via ORAL
  Filled 2020-05-08: qty 1

## 2020-05-08 MED ORDER — OXYCODONE HCL 5 MG PO TABS: 5.0000 mg | ORAL_TABLET | Freq: Once | ORAL | Status: DC | PRN

## 2020-05-08 MED ORDER — HYDROMORPHONE HCL 1 MG/ML IJ SOLN: 0.2500 mg | INTRAMUSCULAR | Status: DC | PRN

## 2020-05-08 MED ORDER — MISOPROSTOL 200 MCG PO TABS
1000.0000 ug | ORAL_TABLET | Freq: Once | ORAL | Status: AC
  Administered 2020-05-08: 1000 ug via RECTAL

## 2020-05-08 MED ORDER — NALOXONE HCL 4 MG/10ML IJ SOLN
1.0000 ug/kg/h | INTRAVENOUS | Status: DC | PRN
  Filled 2020-05-08: qty 5

## 2020-05-08 MED ORDER — FENTANYL CITRATE (PF) 100 MCG/2ML IJ SOLN
INTRAMUSCULAR | Status: AC
  Filled 2020-05-08: qty 2

## 2020-05-08 MED ORDER — SCOPOLAMINE 1 MG/3DAYS TD PT72
MEDICATED_PATCH | TRANSDERMAL | Status: AC
  Filled 2020-05-08: qty 1

## 2020-05-08 MED ORDER — KETOROLAC TROMETHAMINE 30 MG/ML IJ SOLN: 30.0000 mg | Freq: Once | INTRAMUSCULAR | Status: DC | PRN

## 2020-05-08 MED ORDER — SODIUM CHLORIDE 0.9 % IV SOLN
3.0000 g | Freq: Once | INTRAVENOUS | Status: AC
  Administered 2020-05-08: 3 g via INTRAVENOUS
  Filled 2020-05-08: qty 3

## 2020-05-08 MED ORDER — ACETAMINOPHEN 500 MG PO TABS
1000.0000 mg | ORAL_TABLET | Freq: Four times a day (QID) | ORAL | Status: AC
  Administered 2020-05-08 (×2): 1000 mg via ORAL
  Filled 2020-05-08 (×3): qty 2

## 2020-05-08 SURGICAL SUPPLY — 36 items
APL SKNCLS STERI-STRIP NONHPOA (GAUZE/BANDAGES/DRESSINGS) ×1
BENZOIN TINCTURE PRP APPL 2/3 (GAUZE/BANDAGES/DRESSINGS) ×2 IMPLANT
CHLORAPREP W/TINT 26ML (MISCELLANEOUS) ×2 IMPLANT
CLAMP CORD UMBIL (MISCELLANEOUS) IMPLANT
CLOSURE STERI STRIP 1/2 X4 (GAUZE/BANDAGES/DRESSINGS) ×2 IMPLANT
CLOTH BEACON ORANGE TIMEOUT ST (SAFETY) ×2 IMPLANT
DRSG OPSITE POSTOP 4X10 (GAUZE/BANDAGES/DRESSINGS) ×2 IMPLANT
ELECT REM PT RETURN 9FT ADLT (ELECTROSURGICAL) ×2
ELECTRODE REM PT RTRN 9FT ADLT (ELECTROSURGICAL) ×1 IMPLANT
EXTRACTOR VACUUM KIWI (MISCELLANEOUS) IMPLANT
EXTRACTOR VACUUM M CUP 4 TUBE (SUCTIONS) IMPLANT
GLOVE BIO SURGEON STRL SZ7 (GLOVE) ×2 IMPLANT
GLOVE BIOGEL PI IND STRL 7.0 (GLOVE) ×2 IMPLANT
GLOVE BIOGEL PI INDICATOR 7.0 (GLOVE) ×2
GOWN STRL REUS W/TWL LRG LVL3 (GOWN DISPOSABLE) ×4 IMPLANT
KIT ABG SYR 3ML LUER SLIP (SYRINGE) ×2 IMPLANT
NEEDLE HYPO 25X5/8 SAFETYGLIDE (NEEDLE) ×2 IMPLANT
NS IRRIG 1000ML POUR BTL (IV SOLUTION) ×2 IMPLANT
PACK C SECTION WH (CUSTOM PROCEDURE TRAY) ×2 IMPLANT
PAD OB MATERNITY 4.3X12.25 (PERSONAL CARE ITEMS) ×2 IMPLANT
RTRCTR C-SECT PINK 25CM LRG (MISCELLANEOUS) IMPLANT
STRIP CLOSURE SKIN 1/2X4 (GAUZE/BANDAGES/DRESSINGS) IMPLANT
SUT MNCRL 0 VIOLET CTX 36 (SUTURE) ×2 IMPLANT
SUT MONOCRYL 0 CTX 36 (SUTURE) ×4
SUT PLAIN 0 NONE (SUTURE) IMPLANT
SUT PLAIN 2 0 (SUTURE)
SUT PLAIN ABS 2-0 CT1 27XMFL (SUTURE) IMPLANT
SUT VIC AB 0 CT1 27 (SUTURE) ×4
SUT VIC AB 0 CT1 27XBRD ANBCTR (SUTURE) ×2 IMPLANT
SUT VIC AB 2-0 CT1 27 (SUTURE) ×2
SUT VIC AB 2-0 CT1 TAPERPNT 27 (SUTURE) ×1 IMPLANT
SUT VIC AB 4-0 KS 27 (SUTURE) ×2 IMPLANT
SUT VICRYL 0 TIES 12 18 (SUTURE) IMPLANT
TOWEL OR 17X24 6PK STRL BLUE (TOWEL DISPOSABLE) ×2 IMPLANT
TRAY FOLEY W/BAG SLVR 14FR LF (SET/KITS/TRAYS/PACK) IMPLANT
WATER STERILE IRR 1000ML POUR (IV SOLUTION) ×2 IMPLANT

## 2020-05-08 NOTE — Op Note (Signed)
Cesarean Section Procedure Note   Jenny Yu  05/07/2020 - 05/08/2020  Procedure: Emergency Repeat Low Transverse Cesarean section   Indications: Failed induction/ failed TOLAC, Fetal intolerance to labor    Pre-operative Diagnosis: CESAREAN SECTION Failure to dilate, Fetal Intolerance of labor.   Post-operative Diagnosis: Same   Surgeon: Shea Evans, MD  Assistants: none  Anesthesia: epidural   Procedure Details:  The patient was seen in the Labor Room. The risks, benefits, complications, treatment options, and expected outcomes were discussed with the patient. The patient concurred with the proposed plan, giving informed consent. identified as Raynelle Fanning and the procedure verified as Repeat C-Section Delivery. A Time Out was held and the above information confirmed. 2 gm Ancef given.  After induction of anesthesia, the patient was draped and prepped in the usual sterile manner, foley was draining urine well.  A pfannenstiel incision was made and carried down through the subcutaneous tissue to the fascia. Fascial incision was made and extended transversely. The fascia was separated from the underlying rectus tissue superiorly and inferiorly. The peritoneum was identified and entered. Peritoneal incision was extended longitudinally. Alexis-O retractor placed. The utero-vesical peritoneal reflection was incised transversely and the bladder flap was bluntly freed from the lower uterine segment. A low transverse uterine incision was made. Thick meconium noted. Delivered from cephalic presentation was a FEMALE infant with weak cry so cord was clamped and cut at 30 seconds and handed to NICU team in attendance. Cord was thin and nuchal x 3. Apgar scores of 2 at one minute and 8 at five minutes.  Cord ph was sent. Arterial pH 7.23  Cord blood was obtained for evaluation. The placenta was removed Intact and appeared normal. The uterine outline, tubes and ovaries appeared normal}. The uterine  incision was closed with running locked sutures of . A second imbricating layer sutured.   Hemostasis was observed. Alexis retractor removed. Peritoneal closure done with 2-0 Vicryl.  The fascia was then reapproximated with running sutures of 0Vicryl. The subcuticular closure was performed using 2-0plain gut. The skin was closed with 4-0Vicryl.  Instrument, sponge, and needle counts were correct prior the abdominal closure and were correct at the conclusion of the case.   Findings: Thick meconium. Thin cord. Cord around the neck x 3. Poor cry and tone at delivery, cord clamped and cut at 30 seconds. Apgars 2 and 8 at 1 and 5 minutes. Cord pH (arteririal) 7.23  Normal tubes and ovaries and uterus   Estimated Blood Loss:  318 cc    Total IV Fluids: 1700 ml LR   Urine Output: 150CC OF clear urine  Specimens: Cord gas, cord blood and placenta   Complications: no complications  Disposition: PACU - hemodynamically stable.   Maternal Condition: stable   Baby condition / location:  Couplet care / Skin to Skin  Attending Attestation: I performed the procedure.   Signed: Surgeon(s): Shea Evans, MD

## 2020-05-08 NOTE — Lactation Note (Addendum)
This note was copied from a baby's chart. Lactation Consultation Note  Patient Name: Jenny Yu BZJIR'C Date: 05/08/2020 Reason for consult: Initial assessment;Term Age:34 hours  Initial visit to 18 hours old of P2 mother with breastfeeding experience. LC visit delayed due to [facility]. Mother was proactively set up with a DEBP.   Parents are present during visit. Father is holding infant. Mother states infant was not latched. Mother explains she has been pumping and collected ~38mL of EBM. Infant took 2-mL of EBM via bottle.   Mother requests assistance with latch. Undressed infant and set up support pillows for football position to right breast. Latched infant after a few attempts. Infant holds nipple in mouth, noted a few suckles but no swallows. LC encouraged spoonfeed with EBM. Infant took ~37mL.Infant seems content after feeding.   Reviewed newborn behavior, feeding patterns and expectations with mother. Reinforced DEBP frequency/use/care and milk storage.   Plan:   1-Breastfeeding on demand, ensuring a deep, comfortable latch.  2-Undressing infant and place skin to skin when ready to breastfeed 3-Keep infant awake during breastfeeding session: massaging breast, infant's hand/shoulder/feet 4-Hand express or use DEBP for supplementation purposes. 5-Monitor voids and stools as signs good intake.  6-Contact LC as needed for feeds/support/concerns/questions   Maternal Data Has patient been taught Hand Expression?: Yes Does the patient have breastfeeding experience prior to this delivery?: Yes How long did the patient breastfeed?: 9 months ~6 years ago, twins.  Feeding Mother's Current Feeding Choice: Breast Milk and Donor Milk  LATCH Score Latch: Repeated attempts needed to sustain latch, nipple held in mouth throughout feeding, stimulation needed to elicit sucking reflex.  Audible Swallowing: None  Type of Nipple: Everted at rest and after stimulation  Comfort (Breast/Nipple):  Soft / non-tender  Hold (Positioning): Assistance needed to correctly position infant at breast and maintain latch.  LATCH Score: 6   Lactation Tools Discussed/Used Tools: Pump Breast pump type: Double-Electric Breast Pump Pump Education: Setup, frequency, and cleaning;Milk Storage Reason for Pumping: PPH Pumping frequency: 8-12 times in 24h Pumped volume: 7 mL  Interventions Interventions: Breast feeding basics reviewed;Assisted with latch;Skin to skin;Breast massage;Hand express;Adjust position;Support pillows;Position options;Expressed milk;DEBP;Education  Discharge    Consult Status Consult Status: Follow-up Date: 05/09/20 Follow-up type: In-patient    Jenny Yu [name] [name] 05/08/2020, 6:52 PM

## 2020-05-08 NOTE — Lactation Note (Signed)
This note was copied from a baby's chart. Lactation Consultation Note  Patient Name: Jenny Yu KMMNO'T Date: 05/08/2020   Age:34 hours  Attempted to visit with mom but RN Misty Stanley asked LC to come back at a later time, mom was having a bleeding episode, she had a C/S. Asked RN to call LC once mom is stable and ready for LC initial assessment.   Maternal Data    Feeding    LATCH Score                    Lactation Tools Discussed/Used    Interventions    Discharge    Consult Status      Jani Ploeger Venetia Constable 05/08/2020, 11:57 AM

## 2020-05-08 NOTE — Progress Notes (Addendum)
Subjective: POD# 0 Live born female  Birth Weight: 6 lb 14.1 oz (3120 g) APGAR: 2, 8  Newborn Delivery   Birth date/time: [birth year]/2020 00:44:00 Delivery type: C-Section, Low Transverse Trial of labor: Yes C-section categorization: Repeat     Baby name: [place] Delivering provider: MODY, VAISHALI   Circumcision Yes, planning  Feeding: breast  RN contacted CNM regarding bleeding this AM. Patient passed several small clots and soaked 2 pads. Verbal orders were given for IM Methergine and IV Pitocin. RN contacted CNM again reporting continued trickle of blood, firm fundus, and normal vital signs. CNM arrived in room and patient was alert and oriented. Fundus was firm and small trickle of vaginal bleeding noted with fundal massage. Discussed the R/B/A of manual exploration and patient consented to procedure. Fentanyl IV given to by RN. Manual exploration revealed several clots in lower uterine segment that were removed. Bleeding scant after manual exam. Patient tolerated procedure well. QBL 153cc of clots with total QBL 1882cc. Orders given for bolus of LR, continued IV Pitocin, and Unasyn 3gm IV for prophylaxis after sweep. CBC at 1430. Patient is asymptomatic of anemia. Vital signs are stable. Dr. Juliene Pina notified and additional orders given for rectal Cytotec . Discussed reason for Cytotec with patient and family and patient consents. Rectal Cytotec given and patient tolerated well. Foley catheter in place draining clear, yellow urine. Will continue to monitor.   Objective:  Vitals:   05/08/20 1143 05/08/20 1205 05/08/20 1220 05/08/20 1231  BP: 132/84 (!) 152/84 140/83 130/87  Pulse: 61 88 68 69  Resp:      Temp:      TempSrc:      SpO2:      Weight:      Height:         Intake/Output Summary (Last 24 hours) at 05/08/2020 1243 Last data filed at 05/08/2020 1037 Gross per 24 hour  Intake 1420 ml  Output 3132 ml  Net -1712 ml      Recent Labs    05/07/20 0928 05/08/20 0835   WBC 9.9 19.6*  HGB 13.1 12.7  HCT 39.7 39.0  PLT 322 316    Blood type: --/--/O POS (04/17 0945)  Rubella: Immune (08/02 0000)   Physical Exam:  General: alert and cooperative CV: Regular rate and rhythm Resp: clear Abdomen: soft, nontender, normal bowel sounds Incision: clean and dry Uterine Fundus: firm, below umbilicus, nontender Lochia: see narrative above Ext: extremities normal, atraumatic, no cyanosis or edema  Assessment/Plan: 35 y.o.   POD# 0. [id number]                  Principal Problem:   Postpartum care following cesarean delivery 4/18  Routine post-op care Active Problems:   Encounter for induction of labor   Delivery by emergency cesarean   Postpartum hemorrhage 1882 QBL  CBC at 1430  Unasyn 3gm IVPB for prophylaxis after manual sweep  Cytotec given rectally  Continue IV Pitocin  1 liter bolus of LR  Continue to monitor  Dr. Juliene Pina updated with patient status and plan of care.   June Leap, CNM, MSN 05/08/2020, 12:43 PM

## 2020-05-08 NOTE — Anesthesia Postprocedure Evaluation (Signed)
Anesthesia Post Note  Patient: Jenny Yu  Procedure(s) Performed: CESAREAN SECTION (N/A )     Patient location during evaluation: PACU Anesthesia Type: Epidural Level of consciousness: awake and alert and oriented Pain management: pain level controlled Vital Signs Assessment: post-procedure vital signs reviewed and stable Respiratory status: spontaneous breathing, nonlabored ventilation and respiratory function stable Cardiovascular status: blood pressure returned to baseline and stable Postop Assessment: no headache, no backache, no apparent nausea or vomiting and epidural receding Anesthetic complications: no   No complications documented.  Last Vitals:  Vitals:   05/08/20 0410 05/08/20 0635  BP: 138/82 (!) 153/83  Pulse: 74 66  Resp: 18 18  Temp: 37.2 C   SpO2: 100% 100%    Last Pain:  Vitals:   05/08/20 0635  TempSrc:   PainSc: 0-No pain   Pain Goal:                Epidural/Spinal Function Cutaneous sensation: Normal sensation (05/08/20 0635), Patient able to flex knees: Yes (05/08/20 7703), Patient able to lift hips off bed: Yes (05/08/20 4035), Back pain beyond tenderness at insertion site: No (05/08/20 0635), Progressively worsening motor and/or sensory loss: No (05/08/20 2481), Bowel and/or bladder incontinence post epidural: No (05/08/20 8590)  Lannie Fields

## 2020-05-08 NOTE — Transfer of Care (Signed)
Immediate Anesthesia Transfer of Care Note  Patient: Jenny Yu  Procedure(s) Performed: CESAREAN SECTION (N/A )  Patient Location: PACU  Anesthesia Type:Epidural  Level of Consciousness: awake, alert  and patient cooperative  Airway & Oxygen Therapy: Patient Spontanous Breathing  Post-op Assessment: Report given to RN and Post -op Vital signs reviewed and stable  Post vital signs: Reviewed and stable  Last Vitals:  Vitals Value Taken Time  BP    Temp    Pulse 85 05/08/20 0138  Resp 21 05/08/20 0138  SpO2 97 % 05/08/20 0138  Vitals shown include unvalidated device data.  Last Pain:  Vitals:   05/07/20 2235  TempSrc: Oral  PainSc:          Complications: No complications documented.

## 2020-05-08 NOTE — Progress Notes (Signed)
RN entered room to obtain patient's vital signs and to perform fundal assessment. Pt requests RN to wait until she wakes back up from nap to do these, RN will check patient when she wakes up.

## 2020-05-08 NOTE — Lactation Note (Signed)
This note was copied from a baby's chart. Lactation Consultation Note  Patient Name: Jenny Yu Date: 05/08/2020 Age:34 hours  Attempted LC initial visit to 11 hours old infant and P2 mother.  Mother is receiving care at the time of visit. LC will come back to room at another time as possible.     Eustacia Urbanek [name] [name] 05/08/2020, 12:26 PM

## 2020-05-09 DIAGNOSIS — F419 Anxiety disorder, unspecified: Secondary | ICD-10-CM | POA: Diagnosis present

## 2020-05-09 LAB — CBC
HCT: 26.5 % — ABNORMAL LOW (ref 36.0–46.0)
Hemoglobin: 8.5 g/dL — ABNORMAL LOW (ref 12.0–15.0)
MCH: 27.3 pg (ref 26.0–34.0)
MCHC: 32.1 g/dL (ref 30.0–36.0)
MCV: 85.2 fL (ref 80.0–100.0)
Platelets: 272 10*3/uL (ref 150–400)
RBC: 3.11 MIL/uL — ABNORMAL LOW (ref 3.87–5.11)
RDW: 15.1 % (ref 11.5–15.5)
WBC: 13.2 10*3/uL — ABNORMAL HIGH (ref 4.0–10.5)
nRBC: 0 % (ref 0.0–0.2)

## 2020-05-09 MED ORDER — DIPHENHYDRAMINE HCL 25 MG PO CAPS
50.0000 mg | ORAL_CAPSULE | Freq: Once | ORAL | Status: AC
  Administered 2020-05-09: 50 mg via ORAL
  Filled 2020-05-09: qty 2

## 2020-05-09 MED ORDER — BUTALBITAL-APAP-CAFFEINE 50-325-40 MG PO TABS
1.0000 | ORAL_TABLET | Freq: Four times a day (QID) | ORAL | Status: DC | PRN
  Administered 2020-05-10: 1 via ORAL
  Filled 2020-05-09: qty 1

## 2020-05-09 MED ORDER — SODIUM CHLORIDE 0.9 % IV SOLN
500.0000 mg | Freq: Once | INTRAVENOUS | Status: AC
  Administered 2020-05-09: 500 mg via INTRAVENOUS
  Filled 2020-05-09: qty 25

## 2020-05-09 MED ORDER — LORATADINE 10 MG PO TABS
10.0000 mg | ORAL_TABLET | Freq: Every day | ORAL | Status: DC
  Administered 2020-05-09 – 2020-05-10 (×2): 10 mg via ORAL
  Filled 2020-05-09 (×2): qty 1

## 2020-05-09 NOTE — Progress Notes (Signed)
Patient called out. Headache worse. Dorisann Frames notified. Told to stop iron infusion.Iron infusion stopped.Benadryl was given 1109 as ordered.Told to call back in 1 hour if not better.

## 2020-05-09 NOTE — Lactation Note (Signed)
This note was copied from Jenny baby's chart. Lactation Consultation Note  Patient Name: Jenny Yu KPTWS'F Date: 05/09/2020 Reason for consult: Follow-up assessment;Term Age:34 hours  Follow up visit to 45 hours old with 4.17% weight loss of Jenny P2 mother with breastfeeding experience. Baby is sleeping in mother's arms upon arrival. Mother states breastfeeding has improved Jenny lot. Per mother, infant is latching well and seems stay active at breast. Mother explains she has not pumped because infant is latching well. Mother reports supplementing with ~15 mL od DM. Infant has been been having good voids and stools, per mother. Mother states she is feeling better today and has been able to take Jenny few naps, drinking and eating well too. LC shared their excitement.   Feeding plan:  1-Skin to skin 2-Aim for Jenny deep, comfortable latch 3-Breastfeeding on demand or 8-12 times in 24h period. 4-Keep infant awake during breastfeeding session: massaging breast, infant's hand/shoulder/feet 5-Pump or hand-express and offer EBM prior to supplementation. 6-If needed, supplement following guidelines, paced bottle feeding and fullness cues.  7-Monitor voids and stools as signs good intake.  8-Encouraged maternal rest, hydration and food intake.  9-Contact LC as needed for feeds/support/concerns/questions   All questions answered at this time.   Feeding Mother's Current Feeding Choice: Breast Milk and Donor Milk Nipple Type: Slow - flow  Interventions Interventions: Breast feeding basics reviewed;Skin to skin;Expressed milk;DEBP;Education;Breast massage;Hand express  Consult Status Consult Status: Follow-up Date: 05/10/20 Follow-up type: In-patient    Jenny Yu Jenny Yu 05/09/2020, 10:08 PM

## 2020-05-09 NOTE — Progress Notes (Signed)
Jenny Yu was called. Patient having headache. Iron infusion going. Benadryl order given. Told not to stop infusion.

## 2020-05-09 NOTE — Progress Notes (Signed)
Subjective: POD# 1 Live born female  Birth Weight: 6 lb 14.1 oz (3120 g) APGAR: 2, 8  Newborn Delivery   Birth date/time: 05/08/2020 00:44:00 Delivery type: C-Section, Low Transverse Trial of labor: Yes C-section categorization: Repeat     Baby name: [place]  Delivering provider: MODY, VAISHALI   Circumcision Yes, planning  Feeding: breast and bottle  Pain control at delivery:    Reports feeling well. Denies any pain. Asymptomatic of anemia. Bleeding is light with one small clot overnight. Breast and bottle feeding.   Patient reports tolerating PO.   Pain controlled with acetaminophen and ibuprofen (OTC) Denies HA/SOB/C/P/N/V/dizziness. She reports vaginal bleeding as normal, without clots. She is ambulating and urinating without difficulty.     Objective:   Vitals:   05/08/20 1310 05/08/20 1825 05/08/20 2245 05/09/20 0516  BP: 137/80 115/74 126/76 126/75  Pulse: 67 65 64 81  Resp:   18 17  Temp:   98.4 F (36.9 C) 98.4 F (36.9 C)  TempSrc:   Oral Oral  SpO2:  100% 100% 100%  Weight:      Height:          Intake/Output Summary (Last 24 hours) at 05/09/2020 0945 Last data filed at 05/09/2020 0500 Gross per 24 hour  Intake 318.34 ml  Output 3546 ml  Net -3227.66 ml        Recent Labs    05/08/20 1422 05/09/20 0414  WBC 17.0* 13.2*  HGB 10.2* 8.5*  HCT 30.7* 26.5*  PLT 284 272     Blood type: --/--/O POS (04/17 0945)  Rubella: Immune (08/02 0000)   Vaccines:   TDaP          UTD                      COVID-19 Declined  Physical Exam:  General: alert and cooperative CV: Regular rate and rhythm Resp: clear Abdomen: soft, nontender, normal bowel sounds Incision: clean, dry and intact Uterine Fundus: firm, below umbilicus, nontender Lochia: minimal Ext: extremities normal, atraumatic, no cyanosis or edema  Assessment/Plan: 35 y.o.   POD# 1. [id number]                  Principal Problem:   Postpartum care following cesarean delivery 4/18  Encourage  rest when baby rests  Breastfeeding support  Encourage to ambulate  Routine post-op care Active Problems:   Encounter for induction of labor   Delivery by emergency cesarean   Postpartum hemorrhage 1882 QBL   Anemia associated with acute blood loss  Asymptomatic  IV Venofer today  Will start PO Niferex and mag oxide tomorrow   Anxiety  Mood stable  Close PP F/U for mood  Anticipate discharge tomorrow.             June Leap, CNM, MSN 05/09/2020, 9:45 AM

## 2020-05-10 MED ORDER — BUTALBITAL-APAP-CAFFEINE 50-325-40 MG PO TABS: 1.0000 | ORAL_TABLET | Freq: Once | ORAL | Status: DC

## 2020-05-10 MED ORDER — IBUPROFEN 800 MG PO TABS
800.0000 mg | ORAL_TABLET | Freq: Three times a day (TID) | ORAL | 0 refills | Status: DC | PRN
Start: 2020-05-10 — End: 2022-02-28

## 2020-05-10 MED ORDER — MAGNESIUM OXIDE 400 (241.3 MG) MG PO TABS: 400.0000 mg | ORAL_TABLET | Freq: Every day | ORAL | 0 refills | Status: DC

## 2020-05-10 MED ORDER — SENNOSIDES-DOCUSATE SODIUM 8.6-50 MG PO TABS: 2.0000 | ORAL_TABLET | Freq: Every day | ORAL | Status: DC

## 2020-05-10 MED ORDER — OXYCODONE HCL 5 MG PO TABS: 5.0000 mg | ORAL_TABLET | ORAL | 0 refills | Status: DC | PRN

## 2020-05-10 MED ORDER — BUTALBITAL-APAP-CAFFEINE 50-325-40 MG PO TABS: 1.0000 | ORAL_TABLET | Freq: Four times a day (QID) | ORAL | 0 refills | Status: DC | PRN

## 2020-05-10 MED ORDER — POLYSACCHARIDE IRON COMPLEX 150 MG PO CAPS: 150.0000 mg | ORAL_CAPSULE | Freq: Every day | ORAL | 0 refills | Status: DC

## 2020-05-10 MED ORDER — COCONUT OIL OIL: 1.0000 "application " | TOPICAL_OIL | 0 refills | Status: DC | PRN

## 2020-05-10 MED ORDER — ACETAMINOPHEN 500 MG PO TABS: 1000.0000 mg | ORAL_TABLET | Freq: Four times a day (QID) | ORAL | 0 refills | Status: AC | PRN

## 2020-05-10 MED ORDER — SIMETHICONE 80 MG PO CHEW: 80.0000 mg | CHEWABLE_TABLET | Freq: Three times a day (TID) | ORAL | 0 refills | Status: DC

## 2020-05-10 NOTE — Discharge Instructions (Signed)

## 2020-05-10 NOTE — Discharge Summary (Addendum)
OB Discharge Summary  Patient Name: Jenny Yu DOB: 05/18/1985 MRN: [id number]  Date of admission: 05/07/2020 Delivering provider: MODY, VAISHALI   Admitting diagnosis: Encounter for induction of labor [Z34.90] Delivery by emergency cesarean [O99.892] Intrauterine pregnancy: [redacted]w[redacted]d     Secondary diagnosis: Patient Active Problem List   Diagnosis Date Noted  . Anxiety 05/09/2020  . Delivery by emergency cesarean 05/08/2020  . Postpartum hemorrhage 1882 QBL 05/08/2020  . Postpartum care following cesarean delivery 4/18 05/08/2020  . Anemia associated with acute blood loss 05/08/2020  . Encounter for induction of labor 05/07/2020   Additional problems:none  Date of discharge: 05/10/2020   Discharge diagnosis: Principal Problem:   Postpartum care following cesarean delivery 4/18 Active Problems:   Encounter for induction of labor   Delivery by emergency cesarean   Postpartum hemorrhage 1882 QBL   Anemia associated with acute blood loss   Anxiety                                                              Post partum procedures:IV Iron Infusion  Augmentation: N/A Pain control:  PO Ibuprofen and Tylenol  Laceration:  Episiotomy:  Complications: Hemorrhage>1024mL  Hospital course:  Sceduled C/S   35 y.o. yo G2P1102 at [redacted]w[redacted]d was admitted to the hospital 05/07/2020 for IOL for Post datesplaned trial of labor after cesarean section, resulted in repeat cesarean section for fetal intolerance of Labor at 3-4 cm dilation. Delivery details are as follows:  Membrane Rupture Time/Date: 6:09 PM ,05/07/2020   Delivery Method:C-Section, Low Transverse  Details of operation can be found in separate operative note.  Patient had an uncomplicated postpartum course.  She is ambulating, tolerating a regular diet, passing flatus, and urinating well. Patient is discharged home in stable condition on  05/10/20        Newborn Data: Birth date:05/08/2020  Birth time:12:44 AM  Gender:Female  Living  status:Living  Apgars:2 ,8  Weight:3120 g     Physical exam  Vitals:   05/09/20 0516 05/09/20 1407 05/09/20 2039 05/10/20 0522  BP: 126/75 117/62 124/64 (!) 120/56  Pulse: 81 72 80 76  Resp: 17 18 18 16   Temp: 98.4 F (36.9 C) 98.2 F (36.8 C) 98.5 F (36.9 C) 98.2 F (36.8 C)  TempSrc: Oral Oral Oral Oral  SpO2: 100% 100% 100% 99%  Weight:      Height:       General: alert, cooperative and no distress Lochia: minimal bleeding w/o clots.  Uterine Fundus: Firm, midline, below umbilicus.  Incision: Healing well with no significant drainage, HC wet from shower; will change prior to D/C.  Perineum: intact with no edema DVT Evaluation: Minimal non-pitting edema. No sx of DVT.  Labs: Lab Results  Component Value Date   WBC 13.2 (H) 05/09/2020   HGB 8.5 (L) 05/09/2020   HCT 26.5 (L) 05/09/2020   MCV 85.2 05/09/2020   PLT 272 05/09/2020   CMP Latest Ref Rng & Units 05/08/2020  Glucose 70 - 99 mg/dL 05/10/2020)  BUN 6 - 20 mg/dL 384(Y)  Creatinine <6(Z - 1.00 mg/dL 9.93  Sodium 5.70 - 177 mmol/L 137  Potassium 3.5 - 5.1 mmol/L 4.0  Chloride 98 - 111 mmol/L 106  CO2 22 - 32 mmol/L 24  Calcium 8.9 - 10.3 mg/dL 939)  Total Protein 6.5 - 8.1 g/dL 4.8(L)  Total Bilirubin 0.3 - 1.2 mg/dL 0.3  Alkaline Phos 38 - 126 U/L 103  AST 15 - 41 U/L 20  ALT 0 - 44 U/L 16   Edinburgh Postnatal Depression Scale Screening Tool 05/09/2020  I have been able to laugh and see the funny side of things. 0  I have looked forward with enjoyment to things. 0  I have blamed myself unnecessarily when things went wrong. 2  I have been anxious or worried for no good reason. 3  I have felt scared or panicky for no good reason. 2  Things have been getting on top of me. 1  I have been so unhappy that I have had difficulty sleeping. 1  I have felt sad or miserable. 1  I have been so unhappy that I have been crying. 1  The thought of harming myself has occurred to me. 0  Edinburgh Postnatal Depression Scale  Total 11   Vaccines: TDaP          UTD                    COVID-19 Declined  Discharge instruction:  Begin taking Iron Tablets for Acute anemia, and Magnesium Oxide for gut motility. Also sent home with prescription of Fioricet for Migraines. Discharge Instructions for postpartum C-Section care per After Visit Summary,  [facility] booklet and  "Understanding Mother & Baby Care" hospital booklet  After Visit Meds:  Allergies as of 05/10/2020      Reactions   Shellfish Allergy Anaphylaxis   Bee Pollen    Milk-related Compounds Diarrhea   Peanut-containing Drug Products Swelling   Causes swelling of the tongue.      Medication List    STOP taking these medications   MAGNESIUM PO     TAKE these medications   acetaminophen 500 MG tablet Commonly known as: TYLENOL Take 2 tablets (1,000 mg total) by mouth every 6 (six) hours as needed.   butalbital-acetaminophen-caffeine 50-325-40 MG tablet Commonly known as: FIORICET Take 1 tablet by mouth every 6 (six) hours as needed for headache.   coconut oil Oil Apply 1 application topically as needed.   ibuprofen 800 MG tablet Commonly known as: ADVIL Take 1 tablet (800 mg total) by mouth every 8 (eight) hours as needed.   iron polysaccharides 150 MG capsule Commonly known as: NIFEREX Take 1 capsule (150 mg total) by mouth daily.   loratadine 10 MG tablet Commonly known as: CLARITIN Take 10 mg by mouth daily.   magnesium oxide 400 (241.3 Mg) MG tablet Commonly known as: MAG-OX Take 1 tablet (400 mg total) by mouth daily.   melatonin 5 MG Tabs Take 5 mg by mouth at bedtime.   oxyCODONE 5 MG immediate release tablet Commonly known as: Oxy IR/ROXICODONE Take 1-2 tablets (5-10 mg total) by mouth every 4 (four) hours as needed for moderate pain.   prenatal multivitamin Tabs tablet Take 1 tablet by mouth at bedtime.   senna-docusate 8.6-50 MG tablet Commonly known as: Senokot-S Take 2 tablets by mouth daily.   simethicone  80 MG chewable tablet Commonly known as: MYLICON Chew 1 tablet (80 mg total) by mouth 3 (three) times daily after meals.       Diet: iron rich diet at home.    Activity: Advance as tolerated. Pelvic rest for 6 weeks.   Postpartum contraception: Not Discussed  Newborn Data: Live born female  Birth Weight: 6 lb 14.1  oz (3120 g) APGAR: 2, 8  Newborn Delivery   Birth date/time: 05/08/2020 00:44:00 Delivery type: C-Section, Low Transverse Trial of labor: Yes C-section categorization: Repeat     Baby Boy named "Gerilyn Pilgrim" Baby Feeding: Breast Disposition:home with mother Circumcision: Yes, completed this AM in hospital.   Delivery Report:   Review the Delivery Report for details.    Follow up:  Follow-up Information    Obgyn, [employer]. Schedule an appointment as soon as possible for a visit in 6 week(s).   Why: Make appointment for 6 weeks for postpartum follow up.  Contact information: [address] [place] [address] [phone]                 Signed: Warrick Parisian Danella Deis) Suzie Portela, BSN, RNC-OB  Student Nurse-Midwife   05/10/2020  12:52 PM   Medical screening examination/treatment/procedure(s) were conducted as a shared visit with non-physician practitioner(s) and myself.  I personally evaluated the patient during the encounter.   Neta Mends, CNM, MSN 05/10/2020, 6:24 PM

## 2020-05-10 NOTE — Social Work (Signed)
CSW received consult for Edinburgh Score 11. CSW met with MOB to offer support and complete assessment.    CSW met with the MOB at bedside. CSW introduced role and congratulated MOB and FOB. CSW observed MOB siting on the couch, holding and bonding with the infant. FOB present on couch. CSW offered MOB privacy/HIPAA. MOB agreeable for FOB to stay. CSW explain the reason for the visit. MOB pleasant and receptive to [place] visit. CSW inquired how MOB has felt emotionally since giving birth. MOB reports "I am doing okay."  CSW discussed MOB [place] results. MOB reports she was very worried and anxious throughout her pregnancy. MOB reports she had two panic attacks during her third trimester. MOB reports she was worried about the birth and worried about another panic attack while asleep.  MOB reports having to sleep in a recliner in fear that she would have another panic attack. MOB reports she did not have a history of panic attacks prior to this pregnancy. MOB reports the OBGYN prescribed buspirone for anxiety however she took the medication 3 times and stopped taking it because she did not like the way it made her feel. MOB reports the medication made her feel glittery and difficult to sleep. MOB reports one night she only slept for one hour. MOB reports she reached out to her doula trainer for an alternative way to calm her. MOB reports she takes magnesium and melatonin which has helped keep her calm and sleep. MOB reports she had a brief panic attack post-partum when she woke to find the baby was not in the room and later learn the infant was taken to the nursery. MOB reports since then she has felt fine and has been able to get some rest. CSW assessed MOB for safety. MOB denies thoughts of harm to self and others. CSW inquired about MOB supports. MOB acknowledges her spouse and relatives as supports.    CSW inquired if MOB experienced postpartum depression after the birth of her twins. MOB reports she labored  alone during the birth, and it was an adjustment for her and spouse.  CSW provided education regarding the baby blues period vs. perinatal mood disorders, discussed treatment. MOB reports she periodically checks in with her therapist at [facility]. MOB reports she has not had an official appointment but plans to establish an appointment once she has a routine for the baby. CSW recommended MOB complete a self-evaluation during the postpartum time period using the New Mom Checklist from [employer]partum [employer] and encouraged MOB to contact her doctor if concerns arise. MOB receptive to the check list.   MOB knowledgeable of SIDS however CSW reviewed Sudden Infant Death Syndrome (SIDS) precautions and informed MOB no co-sleeping with the infant. MOB reports the infant will sleep in a bassinet. CSW asked MOB if she has essential items for the infant. MOB confirm she has essential items for the infant.  CSW assessed MOB for additional needs. MOB reports no additional need.     CSW identifies no further need for intervention and no barriers to discharge at this time.  Kathrin Greathouse, MSW, LCSW [facility] and [place] Worker  [phone] 05/10/2020  11:41 AM

## 2020-05-10 NOTE — Lactation Note (Signed)
This note was copied from a baby's chart. Lactation Consultation Note  Patient Name: Jenny Yu [id number] Date: 05/10/2020 Reason for consult: Follow-up assessment;Term;Infant weight loss Age:34 hours  Visited with mom of 68 hours old FT female, she's a P2, she had twins in the NICU that never latched but they're now 50 years old. Mom feels BF is going well for this baby, LC OP services were recommended in case she feels she needs further help.   Baby is no longer in donor milk, mom is pumping 55 ml/feeding, baby taking around 30 ml. Reviewed discharge education, encouraged 8-12 STS feedings/24 hours or sooner if feeding cues are present. FOB present and supportive; he assisted with baby's feeding as soon as he was brought in the room from his circumsicion.   Parents reported all questions and concerns were answered, they're both aware of LC OP services and will contact PRN.  Maternal Data    Feeding Mother's Current Feeding Choice: Breast Milk  LATCH Score    Lactation Tools Discussed/Used    Interventions Interventions: Breast feeding basics reviewed  Discharge Discharge Education: Engorgement and breast care;Warning signs for feeding baby;Outpatient recommendation  Consult Status Consult Status: Complete Date: 05/10/20 Follow-up type: Call as needed    Jenny Yu 05/10/2020, 9:16 AM

## 2020-05-11 ENCOUNTER — Encounter (HOSPITAL_COMMUNITY): Payer: Self-pay | Admitting: Obstetrics

## 2020-05-11 ENCOUNTER — Inpatient Hospital Stay (HOSPITAL_COMMUNITY)
Admission: AD | Admit: 2020-05-11 | Discharge: 2020-05-11 | Disposition: A | Discharge status: Home / Self Care | Payer: Managed Care, Other (non HMO) | Attending: Obstetrics | Admitting: Obstetrics

## 2020-05-11 ENCOUNTER — Other Ambulatory Visit: Payer: Self-pay

## 2020-05-11 ENCOUNTER — Inpatient Hospital Stay (HOSPITAL_COMMUNITY): Payer: Managed Care, Other (non HMO)

## 2020-05-11 DIAGNOSIS — O9089 Other complications of the puerperium, not elsewhere classified: Secondary | ICD-10-CM | POA: Diagnosis not present

## 2020-05-11 DIAGNOSIS — O9903 Anemia complicating the puerperium: Secondary | ICD-10-CM

## 2020-05-11 DIAGNOSIS — O99893 Other specified diseases and conditions complicating puerperium: Secondary | ICD-10-CM | POA: Diagnosis not present

## 2020-05-11 DIAGNOSIS — R519 Headache, unspecified: Secondary | ICD-10-CM

## 2020-05-11 DIAGNOSIS — O9081 Anemia of the puerperium: Secondary | ICD-10-CM | POA: Diagnosis not present

## 2020-05-11 DIAGNOSIS — D649 Anemia, unspecified: Secondary | ICD-10-CM | POA: Diagnosis not present

## 2020-05-11 LAB — COMPREHENSIVE METABOLIC PANEL
ALT: 28 U/L (ref 0–44)
AST: 36 U/L (ref 15–41)
Albumin: 2.5 g/dL — ABNORMAL LOW (ref 3.5–5.0)
Alkaline Phosphatase: 84 U/L (ref 38–126)
Anion gap: 8 (ref 5–15)
BUN: <5 mg/dL — ABNORMAL LOW (ref 6–20)
CO2: 25 mmol/L (ref 22–32)
Calcium: 8.9 mg/dL (ref 8.9–10.3)
Chloride: 106 mmol/L (ref 98–111)
Creatinine, Ser: 0.52 mg/dL (ref 0.44–1.00)
GFR, Estimated: >60 mL/min (ref >60)
Glucose, Bld: 113 mg/dL — ABNORMAL HIGH (ref 70–99)
Potassium: 3.9 mmol/L (ref 3.5–5.1)
Sodium: 139 mmol/L (ref 135–145)
Total Bilirubin: 0.3 mg/dL (ref 0.3–1.2)
Total Protein: 5.7 g/dL — ABNORMAL LOW (ref 6.5–8.1)

## 2020-05-11 LAB — CBC
HCT: 28.2 % — ABNORMAL LOW (ref 36.0–46.0)
Hemoglobin: 8.9 g/dL — ABNORMAL LOW (ref 12.0–15.0)
MCH: 27.2 pg (ref 26.0–34.0)
MCHC: 31.6 g/dL (ref 30.0–36.0)
MCV: 86.2 fL (ref 80.0–100.0)
Platelets: 326 10*3/uL (ref 150–400)
RBC: 3.27 MIL/uL — ABNORMAL LOW (ref 3.87–5.11)
RDW: 15.4 % (ref 11.5–15.5)
WBC: 13.7 10*3/uL — ABNORMAL HIGH (ref 4.0–10.5)
nRBC: 0.5 % — ABNORMAL HIGH (ref 0.0–0.2)

## 2020-05-11 LAB — SURGICAL PATHOLOGY

## 2020-05-11 LAB — PREPARE RBC (CROSSMATCH)

## 2020-05-11 MED ORDER — METOCLOPRAMIDE HCL 10 MG PO TABS: 10.0000 mg | ORAL_TABLET | Freq: Three times a day (TID) | ORAL | 0 refills | Status: DC | PRN

## 2020-05-11 MED ORDER — CYCLOBENZAPRINE HCL 10 MG PO TABS: 10.0000 mg | ORAL_TABLET | Freq: Three times a day (TID) | ORAL | 0 refills | Status: DC | PRN

## 2020-05-11 MED ORDER — DIPHENHYDRAMINE HCL 50 MG/ML IJ SOLN
25.0000 mg | Freq: Once | INTRAMUSCULAR | Status: AC
  Administered 2020-05-11: 25 mg via INTRAVENOUS
  Filled 2020-05-11: qty 1

## 2020-05-11 MED ORDER — IOHEXOL 300 MG/ML  SOLN
75.0000 mL | Freq: Once | INTRAMUSCULAR | Status: AC | PRN
  Administered 2020-05-11: 75 mL via INTRAVENOUS

## 2020-05-11 MED ORDER — METOCLOPRAMIDE HCL 5 MG/ML IJ SOLN
10.0000 mg | Freq: Once | INTRAMUSCULAR | Status: AC
  Administered 2020-05-11: 10 mg via INTRAVENOUS
  Filled 2020-05-11: qty 2

## 2020-05-11 MED ORDER — CYCLOBENZAPRINE HCL 5 MG PO TABS
10.0000 mg | ORAL_TABLET | Freq: Once | ORAL | Status: AC
  Administered 2020-05-11: 10 mg via ORAL
  Filled 2020-05-11: qty 2

## 2020-05-11 MED ORDER — SODIUM CHLORIDE 0.9% IV SOLUTION: Freq: Once | INTRAVENOUS | Status: AC

## 2020-05-11 MED ORDER — IBUPROFEN 800 MG PO TABS
800.0000 mg | ORAL_TABLET | Freq: Once | ORAL | Status: AC
  Administered 2020-05-11: 800 mg via ORAL
  Filled 2020-05-11: qty 1

## 2020-05-11 MED ORDER — ACETAMINOPHEN 500 MG PO TABS
1000.0000 mg | ORAL_TABLET | Freq: Once | ORAL | Status: AC
  Administered 2020-05-11: 1000 mg via ORAL
  Filled 2020-05-11: qty 2

## 2020-05-11 MED ORDER — DEXAMETHASONE SODIUM PHOSPHATE 10 MG/ML IJ SOLN
10.0000 mg | Freq: Once | INTRAMUSCULAR | Status: AC
  Administered 2020-05-11: 10 mg via INTRAVENOUS
  Filled 2020-05-11: qty 1

## 2020-05-11 MED ORDER — LACTATED RINGERS IV BOLUS
1000.0000 mL | Freq: Once | INTRAVENOUS | Status: AC
  Administered 2020-05-11: 1000 mL via INTRAVENOUS

## 2020-05-11 NOTE — MAU Provider Note (Signed)
History     CSN: [id number]  Arrival date and time: 05/11/20 5176   Event Date/Time   First Provider Initiated Contact with Patient 05/11/20 [phone]      Chief Complaint  Patient presents with  . Headache   HPI Jenny Yu is a 35 y.o. G2P1102 at 3 days postpartum who presents with headache. Had a repeat c/section on 4/18 & was discharged from the hospital yesterday. Postpartum course complicated by postpartum hemorrhage with anemia. Received  IV venofer on 4/19. Headache started with iron infusion and has not resolved since. Has history of migraines but reports this feels different & much worse than her migraines. Has been taking tylenol, advil, and fioricet without relief. Last treatment was advil early this morning. Rates pain 9/10. Frontal pain that she describes as aching, throbbing, and pulsating. Moving her head makes pain worse. Pain not affected by position changes. Pt told nurse there was some improvement when lying down, but reports to me there is no change in pain regardless of position. Endorses some nausea with headache. Denies fever/chills, vomiting, visual disturbance, epigastric pain, or history of hypertension.   OB History    Gravida  2   Para  2   Term  1   Preterm  1   AB  0   Living  2     SAB  0   IAB  0   Ectopic  0   Multiple  1   Live Births  2           Past Medical History:  Diagnosis Date  . Headache(784.0)   . Trichomonas infection     Past Surgical History:  Procedure Laterality Date  . CESAREAN SECTION  2015  . CESAREAN SECTION N/A 05/08/2020   Procedure: CESAREAN SECTION;  Surgeon: Shea Evans, MD;  Location: [facility] LD [facility]S;  Service: Obstetrics;  Laterality: N/A;  Repeat Fetal intolerance, failure to dilate, late decelerations, meconium  . EYE MUSCLE SURGERY    . WISDOM TOOTH EXTRACTION      Family History  Problem Relation Age of Onset  . Hypertension Mother   . Diabetes Father   . Diabetes Paternal Grandmother   .  Arthritis Paternal Grandmother   . Hypertension Paternal Grandmother   . Heart disease Paternal Grandmother   . Diabetes Paternal Grandfather   . Stroke Paternal Grandfather     Social History   Tobacco Use  . Smoking status: Never Smoker  . Smokeless tobacco: Never Used  Vaping Use  . Vaping Use: Never used  Substance Use Topics  . Alcohol use: No  . Drug use: No    Allergies:  Allergies  Allergen Reactions  . Shellfish Allergy Anaphylaxis  . Bee Pollen   . Milk-Related Compounds Diarrhea  . Peanut-Containing Drug Products Swelling    Causes swelling of the tongue.    Medications Prior to Admission  Medication Sig Dispense Refill Last Dose  . iron polysaccharides (NIFEREX) 150 MG capsule Take 1 capsule (150 mg total) by mouth daily. 30 capsule 0 05/10/2020 at Unknown time  . loratadine (CLARITIN) 10 MG tablet Take 10 mg by mouth daily.   05/10/2020 at Unknown time  . Prenatal Vit-Fe Fumarate-FA (PRENATAL MULTIVITAMIN) TABS tablet Take 1 tablet by mouth at bedtime.   05/10/2020 at Unknown time  . acetaminophen (TYLENOL) 500 MG tablet Take 2 tablets (1,000 mg total) by mouth every 6 (six) hours as needed. 60 tablet 0   . butalbital-acetaminophen-caffeine (FIORICET) 50-325-40 MG tablet Take  1 tablet by mouth every 6 (six) hours as needed for headache. 14 tablet 0   . coconut oil OIL Apply 1 application topically as needed.  0   . ibuprofen (ADVIL) 800 MG tablet Take 1 tablet (800 mg total) by mouth every 8 (eight) hours as needed. 30 tablet 0   . magnesium oxide (MAG-OX) 400 (241.3 Mg) MG tablet Take 1 tablet (400 mg total) by mouth daily. 30 tablet 0   . melatonin 5 MG TABS Take 5 mg by mouth at bedtime.     Marland Kitchen oxyCODONE (OXY IR/ROXICODONE) 5 MG immediate release tablet Take 1-2 tablets (5-10 mg total) by mouth every 4 (four) hours as needed for moderate pain. 30 tablet 0   . senna-docusate (SENOKOT-S) 8.6-50 MG tablet Take 2 tablets by mouth daily.     . simethicone (MYLICON) 80  MG chewable tablet Chew 1 tablet (80 mg total) by mouth 3 (three) times daily after meals. 30 tablet 0     Review of Systems  Constitutional: Negative.   Eyes: Negative.   Gastrointestinal: Positive for nausea.  Neurological: Positive for headaches.   Physical Exam   Blood pressure 121/62, pulse 91, temperature 98.9 F (37.2 C), temperature source Oral, resp. rate 18, height 5\' 4"  (1.626 m), weight 106 kg, SpO2 100 %, unknown if currently breastfeeding.  Physical Exam Constitutional:      Appearance: She is well-developed.     Comments: Pt appears uncomfortable  Cardiovascular:     Rate and Rhythm: Normal rate.     Heart sounds: Normal heart sounds.  Pulmonary:     Effort: Pulmonary effort is normal. No respiratory distress.     Breath sounds: Normal breath sounds.  Skin:    General: Skin is warm and dry.  Neurological:     Mental Status: She is alert and oriented to person, place, and time.     Motor: Motor function is intact.     Gait: Gait is intact.  Psychiatric:        Mood and Affect: Mood normal.        Behavior: Behavior normal.     MAU Course  Procedures Results for orders placed or performed during the hospital encounter of 05/11/20 (from the past 24 hour(s))  CBC     Status: Abnormal   Collection Time: 05/11/20  8:29 AM  Result Value Ref Range   WBC 13.7 (H) 4.0 - 10.5 K/uL   RBC 3.27 (L) 3.87 - 5.11 MIL/uL   Hemoglobin 8.9 (L) 12.0 - 15.0 g/dL   HCT 05/13/20 (L) 26.9 - 48.5 %   MCV 86.2 80.0 - 100.0 fL   MCH 27.2 26.0 - 34.0 pg   MCHC 31.6 30.0 - 36.0 g/dL   RDW 46.2 70.3 - 50.0 %   Platelets 326 150 - 400 K/uL   nRBC 0.5 (H) 0.0 - 0.2 %  Comprehensive metabolic panel     Status: Abnormal   Collection Time: 05/11/20  8:29 AM  Result Value Ref Range   Sodium 139 135 - 145 mmol/L   Potassium 3.9 3.5 - 5.1 mmol/L   Chloride 106 98 - 111 mmol/L   CO2 25 22 - 32 mmol/L   Glucose, Bld 113 (H) 70 - 99 mg/dL   BUN <5 (L) 6 - 20 mg/dL   Creatinine, Ser 05/13/20  0.44 - 1.00 mg/dL   Calcium 8.9 8.9 - 1.82 mg/dL   Total Protein 5.7 (L) 6.5 - 8.1 g/dL   Albumin 2.5 (  L) 3.5 - 5.0 g/dL   AST 36 15 - 41 U/L   ALT 28 0 - 44 U/L   Alkaline Phosphatase 84 38 - 126 U/L   Total Bilirubin 0.3 0.3 - 1.2 mg/dL   GFR, Estimated >93 >81 mL/min   Anion gap 8 5 - 15  Type and screen [facility]     Status: None (Preliminary result)   Collection Time: 05/11/20  9:38 AM  Result Value Ref Range   ABO/RH(D) O POS    Antibody Screen NEG    Sample Expiration 05/14/2020,2359    Unit Number [id number]    Blood Component Type RED CELLS,LR    Unit division 00    Status of Unit ISSUED    Transfusion Status OK TO TRANSFUSE    Crossmatch Result      Compatible Performed at [facility], [address] [place], [place] [address]   Prepare RBC (crossmatch)     Status: None   Collection Time: 05/11/20  9:38 AM  Result Value Ref Range   Order Confirmation      ORDER PROCESSED BY BLOOD BANK Performed at [facility], [address], [place], [place] [address]   CT HEAD W & WO CONTRAST  Result Date: 05/11/2020 CLINICAL DATA:  Headache, new or worsening, cancer history. Additional history provided: Patient reports severe headache, history of cancer and concern for metastases. EXAM: CT HEAD WITHOUT AND WITH CONTRAST TECHNIQUE: Contiguous axial images were obtained from the base of the skull through the vertex without and with intravenous contrast CONTRAST:  4mL OMNIPAQUE IOHEXOL 300 MG/ML  SOLN COMPARISON:  Prior head CT examination 04/13/2007. FINDINGS: Brain: Cerebral volume is normal. There is no acute intracranial hemorrhage. No demarcated cortical infarct. No extra-axial fluid collection. No evidence of intracranial mass. No midline shift. No abnormal intracranial enhancement is identified on post-contrast imaging. Vascular: No hyperdense vessel on precontrast imaging. Skull: Normal. Negative for fracture or focal lesion. Sinuses/Orbits:  Visualized orbits show no acute finding. No significant paranasal sinus disease at the imaged levels. IMPRESSION: No evidence of acute intracranial abnormality. No evidence of intracranial metastatic disease. However, please note a contrast-enhanced brain MRI would have greater sensitivity for this indication and should be considered. Electronically Signed   By: Jackey Loge DO   On: 05/11/2020 12:37     MDM Postpartum patient who presents with severe headache. Headache started when given IV iron on mother baby & hasn't been relieved with any medications at home. She has hx of migraines but reports this is not like migraines and feels worse. Only associated symptom is nausea.  Had borderline htn w/SBP of 138 x 1 = remaining BPs were normal & CMP was normal. Hemoglobin is 8.0 - was 8.5 on 4/19 (after dropping from 13 when she was admitted on 4/17).  Minimal improvement of headache after tylenol 1 gm & flexeril 10 mg followed by IV headache cocktail (decadron, reglan, benadryl).   Pt initially reported no change with position but then stated that it improved when supine & was told it may be related to her epidural. Anesthesia called down to assess patient & agrees that this is not consistent with a spinal headache.   C/w Drs. Ervin & Fogleman. Will proceed with head ct & give patient 1 unit of PRBC to treat anemia.  Ct normal  Pt reports headache improved with 4/10 after IV fluids, eating, and ibuprofen. Dr. Billy Coast updated with patient's pain score, interventions, and  imaging results - ok for patient to be discharged home.   Assessment and Plan   1. Postpartum headache  -Rx reglan & flexeril -given instructions on using medication at home to treat headaches -take other medications previously prescribed  2. Postpartum anemia, baby delivered during previous episode of care  -start taking oral iron supplements -reviewed reasons to return to MAU     Judeth HornErin Jahki Witham 05/11/2020, 8:04 AM

## 2020-05-11 NOTE — MAU Note (Signed)
At 1323 patient called nurse into room because she "felt different". I asked her to describe the feeling and she could not. Vital signs WNL. Estanislado Spire, NP notified. Rate changed from 286mL/hr to 262mL/hr. Patients headache now 4/10 and states that she is beginning to feel better.

## 2020-05-11 NOTE — Discharge Instructions (Signed)
For prevention of migraines: -Magnesium, 400mg  by mouth, once daily -Vitamin B2, 400mg  by mouth, once daily  For treatment of migraines: -take medication at the first sign of the pain of a headache, or the first sign of your aura -start with 1000mg  Tylenol (do not exceed 4000mg  of Tylenol in 24hrs) OR 600 mg of ibuprofen, with Reglan 10mg  -if no relief after 1-2hours, can take Flexeril 10mg  -if headache is severe and not relieved by the above, may take Fioricet, 1 tablet, no more than 3 days per month -Fioricet should only be used as a rescue medication, when absolutely necessary -if the above regimen does not resolve your headache at all, please come to MAU for additional treatment -if you take Fioricet, please be aware that this has Tylenol in it and will contribute to the 4,000mg  of Tylenol that you are allowed to take per day

## 2020-05-11 NOTE — MAU Note (Signed)
Jenny Yu is a 35 y.o. here in MAU reporting: s/p c/s on 05/08/20. Reporting a headache since Tuesday. States headache has gotten worse. Last took 800mg  of advil around 0200 with no relief. States she has some relief from pain when laying down but states she still has a headache.  Onset of complaint: ongoing  Pain score: 9/10  Vitals:   05/11/20 0741  BP: 132/78  Pulse: 88  Resp: 18  Temp: 98.5 F (36.9 C)  SpO2: 99%     Lab orders placed from triage: none

## 2020-05-12 LAB — BPAM RBC
Blood Product Expiration Date: 202205202359
ISSUE DATE / TIME: 202204211236
Unit Type and Rh: 5100

## 2020-05-12 LAB — TYPE AND SCREEN
ABO/RH(D): O POS
Antibody Screen: NEGATIVE
Unit division: 0

## 2022-02-28 ENCOUNTER — Encounter: Payer: Self-pay | Admitting: Medical

## 2022-02-28 ENCOUNTER — Ambulatory Visit: Payer: 59 | Admitting: Medical

## 2022-02-28 VITALS — Temp 98.9°F | Ht 64.0 in | Wt 230.4 lb

## 2022-02-28 DIAGNOSIS — I951 Orthostatic hypotension: Secondary | ICD-10-CM

## 2022-02-28 DIAGNOSIS — R519 Headache, unspecified: Secondary | ICD-10-CM

## 2022-02-28 DIAGNOSIS — Z131 Encounter for screening for diabetes mellitus: Secondary | ICD-10-CM

## 2022-02-28 DIAGNOSIS — Z862 Personal history of diseases of the blood and blood-forming organs and certain disorders involving the immune mechanism: Secondary | ICD-10-CM | POA: Diagnosis not present

## 2022-02-28 DIAGNOSIS — H9313 Tinnitus, bilateral: Secondary | ICD-10-CM | POA: Diagnosis not present

## 2022-02-28 DIAGNOSIS — Z6839 Body mass index (BMI) 39.0-39.9, adult: Secondary | ICD-10-CM

## 2022-02-28 DIAGNOSIS — R42 Dizziness and giddiness: Secondary | ICD-10-CM

## 2022-02-28 MED ORDER — MECLIZINE HCL 25 MG PO TABS: 25.0000 mg | ORAL_TABLET | Freq: Two times a day (BID) | ORAL | 0 refills | Status: DC

## 2022-02-28 NOTE — Progress Notes (Signed)
Subjective:  Jenny Yu is a 37 y.o. female who presents for Chief Complaint  Patient presents with   dizzy spells    Dizzy spells for a couple months. Happens off and on randomly and will be sitting, eating, looking at phone, standing. Only lasting 2-4 minutes at a time     New patient today to establish care.  Sees [facility]   She notes dizzy spells intermittent.  Brief, lasts 2-4 minutes at a time.  Nothing seems to trigger, can just happen out of the blue.   Started maybe in 12/2021.  Gets lightheaded, but sometimes room spinning sensation.  Sometimes dry mouth.  Sometimes can be nauseated the same day she gets some dizziness.  Typically eats 3 times daily.  No chest pain, no palpations.   Does sometimes get ringing in ear.  Gets headaches fairly often.  Gets at least 1-2 headaches weekly, typically right temple/front of head or across bilat frontal.  Headaches can last all day, sometimes awakes with headaches.  No associated nausea, no numbness, no tingling, no weakness, no hearing change.     Hasn't been to eye doctor in 3 years.  Vision lately seems to be changing.    Sometimes  blurred vision, but no loss of vision.  Takes nothing for headaches.  Not sure what trigger headaches.  No headache with sexual activity or exercise.  Periods are heavy.    No incontinence  Has hx/o iron and blood transfusion.  Is currently nursing her child.  No other aggravating or relieving factors.    No other c/o.  The following portions of the patient's history were reviewed and updated as appropriate: allergies, current medications, past family history, past medical history, past social history, past surgical history and problem list.  ROS Otherwise as in subjective above    Objective: Temp 98.9 F (37.2 C)   Ht 5\' 4"  (1.626 m)   Wt 230 lb 6.4 oz (104.5 kg)   LMP 02/14/2022   BMI 39.55 kg/m   General appearance: alert, no distress, well developed, well nourished HEENT:  normocephalic, sclerae anicteric, conjunctiva pink and moist, TMs pearly, nares patent, no discharge or erythema, pharynx normal Oral cavity: MMM, no lesions Neck: supple, no lymphadenopathy, no thyromegaly, no masses, no bruits Heart: RRR, normal S1, S2, no murmurs Lungs: CTA bilaterally, no wheezes, rhonchi, or rales Pulses: 2+ radial pulses, 2+ pedal pulses, normal cap refill Ext: no edema Neuro: Cn2-12 intact, nonfoncal exam Psych: pleasant ,good eye contact, answers questions appropriately    Assessment: Encounter Diagnoses  Name Primary?   Frequent headaches    Dizziness Yes   Lightheaded    Tinnitus of both ears    History of anemia    BMI 39.0-39.9,adult    Screening for diabetes mellitus    Orthostasis      Plan: I reviewed recent labs she had from 11/2021 showing normal TSH and FT4, CBC with slightly elevated platelets, slightly low MCV, but normal WBC and RBC and hemoglobin.  Additional lab as below today  Dizziness, vertigo - referral to vestibular rehab, trial of meclizine if desired, short term.  I reviewed her recent labs.  Of note, she was somewhat orthostatic today.  Advised significant increase in water, particularly if nursing, but decrease caffeine.     Headaches- she drinks a lot of caffeine and knows her vision has changed.  F/u with eye doctor for updated eval since last visit a few years ago.  Cut back on caffeine.  Keep headache diary and recheck in a month or so   [name] was seen today for dizzy spells.  Diagnoses and all orders for this visit:  Dizziness -     Comprehensive metabolic panel  Frequent headaches -     Comprehensive metabolic panel  Lightheaded  Tinnitus of both ears  History of anemia  BMI 39.0-39.9,adult -     Hemoglobin A1c  Screening for diabetes mellitus -     Hemoglobin A1c  Orthostasis  Other orders -     meclizine (ANTIVERT) 25 MG tablet; Take 1 tablet (25 mg total) by mouth 2 (two) times daily.    Follow  up: pending labs

## 2022-03-01 LAB — COMPREHENSIVE METABOLIC PANEL
ALT: 8 IU/L (ref 0–32)
AST: 11 IU/L (ref 0–40)
Albumin/Globulin Ratio: 1.6 (ref 1.2–2.2)
Albumin: 4.4 g/dL (ref 3.9–4.9)
Alkaline Phosphatase: 68 IU/L (ref 44–121)
BUN/Creatinine Ratio: 11 (ref 9–23)
BUN: 7 mg/dL (ref 6–20)
Bilirubin Total: 0.2 mg/dL (ref 0.0–1.2)
CO2: 22 mmol/L (ref 20–29)
Calcium: 9.4 mg/dL (ref 8.7–10.2)
Chloride: 101 mmol/L (ref 96–106)
Creatinine, Ser: 0.61 mg/dL (ref 0.57–1.00)
Globulin, Total: 2.8 g/dL (ref 1.5–4.5)
Glucose: 103 mg/dL — ABNORMAL HIGH (ref 70–99)
Potassium: 4.3 mmol/L (ref 3.5–5.2)
Sodium: 138 mmol/L (ref 134–144)
Total Protein: 7.2 g/dL (ref 6.0–8.5)
eGFR: 119 mL/min/{1.73_m2} (ref >59)

## 2022-03-01 LAB — HEMOGLOBIN A1C
Est. average glucose Bld gHb Est-mCnc: 114 mg/dL
Hgb A1c MFr Bld: 5.6 % (ref 4.8–5.6)

## 2022-03-01 NOTE — Progress Notes (Signed)
Put in order for Vestibular PT

## 2022-03-01 NOTE — Addendum Note (Signed)
Addended by: Minette Headland A on: 03/01/2022 09:25 AM   Modules accepted: Orders

## 2022-03-01 NOTE — Progress Notes (Signed)
Results sent through MyChart

## 2023-08-21 NOTE — Progress Notes (Signed)
 [facility] [phone]   [phone]  CONSULT NOTE  REFERRING PHYSICIAN: Joal Eakle Law DO  REASON FOR CONSULTATION:  Anemia and thrombocytosis   HPI Jenny Yu is a 38 y.o. female with a past medical history significant for postpartum hemorrhage, anxiety, and dysmenorrhea  is referred to the clinic for microcytic anemia and thrombocytosis.   The patient saw her PCP on 07/10/23 for an acute visit due to a sore throat. Lab work performed at that time revealed microcytic anemia with a hemoglobin (Hgb) of 9.7 and thrombocytosis with a platelet count of 488,000. She was referred to the clinic for further evaluation of these findings.  Per chart review, her Hgb on 11/21/22 also demonstrated microcytic anemia with an Hgb of 9.9 and an MCV of 66.4.  Of note, Her Hbg from 12/10/21 was normal at 12.2 with some microcytosis.   The oldest records available date back to 2009, when she had mild microcytic anemia with an Hgb of 11.5. Her Hgb was normal in 2015. In 2020, she again had anemia with an Hgb of 9.9. Upon hospital admission in April 2022, her Hgb was normal; however, she experienced blood loss during a cesarean section. Subsequently, her anemia worsened, with Hgb trending down to 8.5. She reports receiving one unit of blood and IV iron  at that time. Chart review confirms she received one dose of Venofer .  She reports heavy menstrual cycles occurring every 16 to 17 days, lasting about 7 days, with significant blood loss requiring frequent changes of super tampons and pads, often with clotting. She estimates changing her tampon every 1 to 1.5 hours. She has been working with her gynecologist regarding management options, including discussions about endometrial ablation versus hysterectomy. Her gynecologist recently left the practice, and she plans to establish care with another provider within the same practice to continue exploring options. The patient states  she is finished having children.  Her dietary intake of red meat is low, approximately twice per month. She does not take iron  supplements due to constipation. She reports cravings for ice chips, which she associates with low iron  levels.  Current symptoms include fatigue, lightheadedness, and shortness of breath with exertion. She also experiences night sweats, often waking up with her clothes sticking to her. She denies other abnormal bleeding or bruising and has no history of hemorrhoids. She denies NSAID use and any history of bariatric surgery.  Family history is notable for a maternal cousin with sickle cell disease, a mother with hypertension, and a maternal grandmother who had colon cancer at age 37-65. Her father has diabetes.  Social history: She works as a Geophysicist/field seismologist, is married, and has three children. She is a never-smoker and consumes alcohol occasionally, about twice a month.   HPI  Past Medical History:  Diagnosis Date   Allergies    Frequent headaches    Headache(784.0)    History of blood transfusion    Trichomonas infection    Wears glasses     Past Surgical History:  Procedure Laterality Date   CESAREAN SECTION  2015   CESAREAN SECTION N/A 05/08/2020   Procedure: CESAREAN SECTION;  Surgeon: Barbette Knock, MD;  Location: [facility];  Service: Obstetrics;  Laterality: N/A;  Repeat Fetal intolerance, failure to dilate, late decelerations, meconium   EYE MUSCLE SURGERY     WISDOM TOOTH EXTRACTION      Family History  Problem Relation Age of Onset   Hypertension Mother    Diabetes Father  Diabetes Paternal Grandmother    Arthritis Paternal Grandmother    Hypertension Paternal Grandmother    Heart disease Paternal Grandmother    Diabetes Paternal Grandfather    Stroke Paternal Grandfather     Social History Social History   Tobacco Use   Smoking status: Never   Smokeless tobacco: Never  Vaping Use   Vaping status: Never Used  Substance Use  Topics   Alcohol use: No   Drug use: No    Allergies  Allergen Reactions   Shellfish Allergy Anaphylaxis   Bee Pollen    Milk-Related Compounds Diarrhea   Peanut-Containing Drug Products Swelling    Causes swelling of the tongue.    Current Outpatient Medications  Medication Sig Dispense Refill   ondansetron  (ZOFRAN -ODT) 8 MG disintegrating tablet Take 8 mg by mouth as needed for nausea.     semaglutide-weight management (WEGOVY) 0.25 MG/0.5ML SOAJ SQ injection Inject 0.25 mg into the skin.     No current facility-administered medications for this visit.   Facility-Administered Medications Ordered in Other Visits  Medication Dose Route Frequency Provider Last Rate Last Admin   acetaminophen  (TYLENOL ) tablet 650 mg  650 mg Oral Once Doretta Remmert L, PA-C       diphenhydrAMINE  (BENADRYL ) capsule 25 mg  25 mg Oral Once Nichalos Brenton L, PA-C       iron  sucrose (VENOFER ) injection 200 mg  200 mg Intravenous Once Manisha Cancel L, PA-C        REVIEW OF SYSTEMS:   Review of Systems  Constitutional: Positive for fatigue. Negative for appetite change, chills, fever and unexpected weight change.  HENT: Negative for mouth sores, nosebleeds, sore throat and trouble swallowing.   Eyes: Negative for eye problems and icterus.  Respiratory: Positive for occasional dyspnea on exertion. Negative for cough, hemoptysis, and wheezing.   Cardiovascular: Negative for chest pain and leg swelling.  Gastrointestinal: Negative for abdominal pain, constipation, diarrhea, nausea and vomiting.  Genitourinary: Negative for bladder incontinence, difficulty urinating, dysuria, frequency and hematuria.   Musculoskeletal: Negative for back pain, gait problem, neck pain and neck stiffness.  Skin: Negative for itching and rash.  Neurological: Positive for occasional lightheadedness. Negative for dizziness, extremity weakness, gait problem, headaches, and seizures.  Hematological:  Negative for adenopathy. Does not bruise/bleed easily.  Psychiatric/Behavioral: Negative for confusion, depression and sleep disturbance. The patient is not nervous/anxious.     PHYSICAL EXAMINATION:  Blood pressure (!) 118/56, pulse 76, temperature 97.7 F (36.5 C), resp. rate 20, weight 218 lb 11.2 oz (99.2 kg), SpO2 100%, unknown if currently breastfeeding.  ECOG PERFORMANCE STATUS: 1  Physical Exam  Constitutional: Oriented to person, place, and time and well-developed, well-nourished, and in no distress.  HENT:  Head: Normocephalic and atraumatic.  Mouth/Throat: Oropharynx is clear and moist. No oropharyngeal exudate.  Eyes: Conjunctivae are normal. Right eye exhibits no discharge. Left eye exhibits no discharge. No scleral icterus.  Neck: Normal range of motion. Neck supple.  Cardiovascular: Normal rate, regular rhythm, normal heart sounds and intact distal pulses.   Pulmonary/Chest: Effort normal and breath sounds normal. No respiratory distress. No wheezes. No rales.  Abdominal: Soft. Bowel sounds are normal. Exhibits no distension and no mass. There is no tenderness.  Musculoskeletal: Normal range of motion. Exhibits no edema.  Lymphadenopathy:    No cervical adenopathy.  Neurological: Alert and oriented to person, place, and time. Exhibits normal muscle tone. Gait normal. Coordination normal.  Skin: Skin is warm and dry. No rash noted. Not diaphoretic.  No erythema. No pallor.  Psychiatric: Mood, memory and judgment normal.  Vitals reviewed.  LABORATORY DATA: Lab Results  Component Value Date   WBC 9.4 08/22/2023   HGB 8.8 (L) 08/22/2023   HCT 30.6 (L) 08/22/2023   MCV 66.7 (L) 08/22/2023   PLT 512 (H) 08/22/2023      Chemistry      Component Value Date/Time   NA 136 08/22/2023 1205   NA 138 02/28/2022 1533   K 3.9 08/22/2023 1205   CL 106 08/22/2023 1205   CO2 23 08/22/2023 1205   BUN 6 08/22/2023 1205   BUN 7 02/28/2022 1533   CREATININE 0.58 08/22/2023 1205       Component Value Date/Time   CALCIUM 8.8 (L) 08/22/2023 1205   ALKPHOS 48 08/22/2023 1205   AST 8 (L) 08/22/2023 1205   ALT 8 08/22/2023 1205   BILITOT 0.5 08/22/2023 1205       RADIOGRAPHIC STUDIES: No results found.  ASSESSMENT: This pleasant 38 year old African-American female with microcytic anemia, likely iron  deficiency anemia (IDA) secondary to menorrhagia.  Anemia - Patient was seen with Dr. Federico today. -Labs including CBC, CMP, iron  studies, ferritin, and reticulocyte panel were drawn today..  -Iron  deficiency anemia is suspected. Plan to initiate IV iron  therapy, with either Venofer  200 mg weekly x 5 doses or Feraheme 510 mg weekly x 2 doses, depending on insurance coverage. The infusion will be arranged at the [facility]. -I provided the patient with handouts on the side effects of IV iron  per her request (on the After Visit Summary). -She reports intolerance to oral iron  supplements.  -Follow-up labs will be drawn approximately 4-6 weeks after completion of the iron  infusions, with an office visit scheduled the following week to review results. -I also gave her educational material on iron -rich foods and advised increasing dietary intake accordingly.  Dysmenorrhea -She will continue management with her gynecologist regarding options for heavy and frequent menstrual cycles.  Reactive thrombocytosis due to iron  deficiency anemia -Likely reactive thrombocytosis secondary to iron  deficiency anemia.   The patient voices understanding of current disease status and treatment options and is in agreement with the current care plan.  All questions were answered. The patient knows to call the clinic with any problems, questions or concerns. We can certainly see the patient much sooner if necessary.  Thank you so much for allowing me to participate in the care of Alantra Balfour. I will continue to follow up the patient with you and assist in her  care.  I spent 30 minutes counseling the patient face to face. The total time spent in the appointment was 60 minutes.  Disclaimer: This note was dictated with voice recognition software. Similar sounding words can inadvertently be transcribed and may not be corrected upon review.   Norleen ONEIDA Federico IV September 16, 2023, 3:16 PM   I have read the above note and personally examined the patient. I agree with the assessment and plan as noted above.  Briefly Ms. Edelen is a 38 year old female who presents for evaluation of iron  deficiency anemia in the setting of heavy GYN bleeding.  The patient has had difficulty tolerating p.o. iron  therapy due to stomach upset.  Given her history of iron  deficiency I would recommend repeating her iron  labs today and if found to be iron  deficient pursuing IV iron  therapy.  The patient voiced understanding of our findings and plan moving forward.  Additionally we recommend continued follow-up with OB/GYN for better  control of her menstrual cycles.   Norleen IVAR Kidney, MD Department of Hematology/Oncology [facility] Phone: [phone] Pager: [phone] Email: [email or web address]

## 2023-08-22 ENCOUNTER — Other Ambulatory Visit: Payer: Self-pay | Admitting: Physician Assistant

## 2023-08-22 ENCOUNTER — Inpatient Hospital Stay

## 2023-08-22 ENCOUNTER — Telehealth: Payer: Self-pay

## 2023-08-22 ENCOUNTER — Inpatient Hospital Stay: Attending: Physician Assistant | Admitting: Physician Assistant

## 2023-08-22 VITALS — BP 118/56 | HR 76 | Temp 97.7°F | Resp 20 | Wt 218.7 lb

## 2023-08-22 DIAGNOSIS — D62 Acute posthemorrhagic anemia: Secondary | ICD-10-CM

## 2023-08-22 DIAGNOSIS — D649 Anemia, unspecified: Secondary | ICD-10-CM

## 2023-08-22 DIAGNOSIS — D75838 Other thrombocytosis: Secondary | ICD-10-CM | POA: Diagnosis not present

## 2023-08-22 DIAGNOSIS — N946 Dysmenorrhea, unspecified: Secondary | ICD-10-CM | POA: Diagnosis not present

## 2023-08-22 LAB — CBC WITH DIFFERENTIAL (CANCER CENTER ONLY)
Abs Immature Granulocytes: 0.02 K/uL (ref 0.00–0.07)
Basophils Absolute: 0 K/uL (ref 0.0–0.1)
Basophils Relative: 0 %
Eosinophils Absolute: 0.1 K/uL (ref 0.0–0.5)
Eosinophils Relative: 1 %
HCT: 30.6 % — ABNORMAL LOW (ref 36.0–46.0)
Hemoglobin: 8.8 g/dL — ABNORMAL LOW (ref 12.0–15.0)
Immature Granulocytes: 0 %
Lymphocytes Relative: 27 %
Lymphs Abs: 2.5 K/uL (ref 0.7–4.0)
MCH: 19.2 pg — ABNORMAL LOW (ref 26.0–34.0)
MCHC: 28.8 g/dL — ABNORMAL LOW (ref 30.0–36.0)
MCV: 66.7 fL — ABNORMAL LOW (ref 80.0–100.0)
Monocytes Absolute: 0.4 K/uL (ref 0.1–1.0)
Monocytes Relative: 4 %
Neutro Abs: 6.4 K/uL (ref 1.7–7.7)
Neutrophils Relative %: 68 %
Platelet Count: 512 K/uL — ABNORMAL HIGH (ref 150–400)
RBC: 4.59 MIL/uL (ref 3.87–5.11)
RDW: 18.2 % — ABNORMAL HIGH (ref 11.5–15.5)
WBC Count: 9.4 K/uL (ref 4.0–10.5)
nRBC: 0 % (ref 0.0–0.2)

## 2023-08-22 LAB — SAMPLE TO BLOOD BANK

## 2023-08-22 LAB — CMP (CANCER CENTER ONLY)
ALT: 8 U/L (ref 0–44)
AST: 8 U/L — ABNORMAL LOW (ref 15–41)
Albumin: 4.3 g/dL (ref 3.5–5.0)
Alkaline Phosphatase: 48 U/L (ref 38–126)
Anion gap: 7 (ref 5–15)
BUN: 6 mg/dL (ref 6–20)
CO2: 23 mmol/L (ref 22–32)
Calcium: 8.8 mg/dL — ABNORMAL LOW (ref 8.9–10.3)
Chloride: 106 mmol/L (ref 98–111)
Creatinine: 0.58 mg/dL (ref 0.44–1.00)
GFR, Estimated: >60 mL/min (ref >60)
Glucose, Bld: 89 mg/dL (ref 70–99)
Potassium: 3.9 mmol/L (ref 3.5–5.1)
Sodium: 136 mmol/L (ref 135–145)
Total Bilirubin: 0.5 mg/dL (ref 0.0–1.2)
Total Protein: 7.6 g/dL (ref 6.5–8.1)

## 2023-08-22 LAB — IRON AND IRON BINDING CAPACITY (CC-WL,HP ONLY)
Iron: 24 ug/dL — ABNORMAL LOW (ref 28–170)
Saturation Ratios: 5 % — ABNORMAL LOW (ref 10.4–31.8)
TIBC: 465 ug/dL — ABNORMAL HIGH (ref 250–450)
UIBC: 441 ug/dL (ref 148–442)

## 2023-08-22 LAB — FERRITIN: Ferritin: 10 ng/mL — ABNORMAL LOW (ref 11–307)

## 2023-08-22 LAB — RETIC PANEL
Immature Retic Fract: 22.9 % — ABNORMAL HIGH (ref 2.3–15.9)
RBC.: 4.63 MIL/uL (ref 3.87–5.11)
Retic Count, Absolute: 46.8 K/uL (ref 19.0–186.0)
Retic Ct Pct: 1 % (ref 0.4–3.1)
Reticulocyte Hemoglobin: 19.7 pg — ABNORMAL LOW (ref >27.9)

## 2023-08-22 NOTE — Telephone Encounter (Addendum)
 Jenny Yu, patient will be scheduled as soon as possible.  Auth Submission: NO AUTH NEEDED Site of care: Site of care: [facility] Payer: [employer] [place] medicaid Medication & CPT/J Code(s) submitted: Venofer  (Iron  Sucrose) J1756 Diagnosis Code:  Route of submission (phone, fax, portal):  Phone # Fax # Auth type: Buy/Bill PB Units/visits requested: 200mg  x 5 doses Reference number:  Approval from: 08/22/23 to 12/22/23

## 2023-08-26 ENCOUNTER — Telehealth: Payer: Self-pay

## 2023-08-26 NOTE — Telephone Encounter (Signed)
 Spoke with patient regarding recent iron  lab results. Per Cassie, PA, patient's iron  level is low, and the recommendation is to proceed with iron  infusions as scheduled. Patient voiced understanding.

## 2023-08-29 ENCOUNTER — Ambulatory Visit (INDEPENDENT_AMBULATORY_CARE_PROVIDER_SITE_OTHER)

## 2023-08-29 VITALS — BP 105/71 | HR 77 | Temp 98.5°F | Resp 16 | Ht 64.0 in | Wt 217.4 lb

## 2023-08-29 DIAGNOSIS — N920 Excessive and frequent menstruation with regular cycle: Secondary | ICD-10-CM

## 2023-08-29 DIAGNOSIS — D62 Acute posthemorrhagic anemia: Secondary | ICD-10-CM

## 2023-08-29 DIAGNOSIS — D509 Iron deficiency anemia, unspecified: Secondary | ICD-10-CM | POA: Diagnosis not present

## 2023-08-29 MED ORDER — DIPHENHYDRAMINE HCL 25 MG PO CAPS: 25.0000 mg | ORAL_CAPSULE | Freq: Once | ORAL | Status: DC

## 2023-08-29 MED ORDER — ACETAMINOPHEN 325 MG PO TABS
650.0000 mg | ORAL_TABLET | Freq: Once | ORAL | Status: DC
Start: 2023-08-29 — End: 2023-08-29

## 2023-08-29 MED ORDER — IRON SUCROSE 20 MG/ML IV SOLN
200.0000 mg | Freq: Once | INTRAVENOUS | Status: AC
Start: 2023-08-29 — End: 2023-08-29
  Administered 2023-08-29: 200 mg via INTRAVENOUS
  Filled 2023-08-29: qty 10

## 2023-08-29 NOTE — Progress Notes (Signed)
 Diagnosis: Iron Deficiency Anemia  Provider:  Chilton Greathouse MD  Procedure: IV Push  IV Type: Peripheral, IV Location: L Antecubital  Venofer (Iron Sucrose), Dose: 200 mg  Post Infusion IV Care: Observation period completed and Peripheral IV Discontinued  Discharge: Condition: Good, Destination: Home . AVS Provided  Performed by:  Rico Ala, LPN    Patient refused pre-medications. Nurse educated patient and stressed the importance of taking pre-medications as a precaution in the event of a medication reaction. Patient verbalized understanding.

## 2023-09-05 ENCOUNTER — Ambulatory Visit

## 2023-09-05 MED ORDER — ACETAMINOPHEN 325 MG PO TABS
650.0000 mg | ORAL_TABLET | Freq: Once | ORAL | Status: DC
Start: 2023-09-05 — End: 2023-10-16

## 2023-09-05 MED ORDER — DIPHENHYDRAMINE HCL 25 MG PO CAPS
25.0000 mg | ORAL_CAPSULE | Freq: Once | ORAL | Status: DC
Start: 2023-09-05 — End: 2023-10-16

## 2023-09-05 MED ORDER — IRON SUCROSE 20 MG/ML IV SOLN
200.0000 mg | Freq: Once | INTRAVENOUS | Status: DC
Start: 2023-09-05 — End: 2023-10-16

## 2023-09-12 ENCOUNTER — Ambulatory Visit (INDEPENDENT_AMBULATORY_CARE_PROVIDER_SITE_OTHER)

## 2023-09-12 VITALS — BP 110/74 | HR 75 | Temp 97.8°F | Resp 16 | Ht 64.0 in | Wt 218.8 lb

## 2023-09-12 DIAGNOSIS — D509 Iron deficiency anemia, unspecified: Secondary | ICD-10-CM

## 2023-09-12 DIAGNOSIS — D62 Acute posthemorrhagic anemia: Secondary | ICD-10-CM

## 2023-09-12 DIAGNOSIS — N920 Excessive and frequent menstruation with regular cycle: Secondary | ICD-10-CM

## 2023-09-12 MED ORDER — IRON SUCROSE 20 MG/ML IV SOLN
200.0000 mg | Freq: Once | INTRAVENOUS | Status: AC
  Administered 2023-09-12: 200 mg via INTRAVENOUS
  Filled 2023-09-12: qty 10

## 2023-09-12 MED ORDER — ACETAMINOPHEN 325 MG PO TABS: 650.0000 mg | ORAL_TABLET | Freq: Once | ORAL | Status: DC

## 2023-09-12 MED ORDER — DIPHENHYDRAMINE HCL 25 MG PO CAPS: 25.0000 mg | ORAL_CAPSULE | Freq: Once | ORAL | Status: DC

## 2023-09-12 MED ORDER — SODIUM CHLORIDE 0.9 % IV BOLUS
250.0000 mL | Freq: Once | INTRAVENOUS | Status: DC
  Filled 2023-09-12: qty 250

## 2023-09-12 NOTE — Progress Notes (Signed)
 Diagnosis: Iron  Deficiency Anemia  Provider:  Praveen Mannam MD  Procedure: IV Push  IV Type: Peripheral, IV Location: L Antecubital  Venofer  (Iron  Sucrose), Dose: 200 mg  Post Infusion IV Care: Observation period completed and Peripheral IV Discontinued  Discharge: Condition: Good, Destination: Home . AVS Declined  Performed by:  Maximiano JONELLE Pouch, LPN

## 2023-09-15 ENCOUNTER — Encounter: Payer: Self-pay | Admitting: Physician Assistant

## 2023-09-16 ENCOUNTER — Encounter: Payer: Self-pay | Admitting: Physician Assistant

## 2023-09-16 ENCOUNTER — Ambulatory Visit (HOSPITAL_BASED_OUTPATIENT_CLINIC_OR_DEPARTMENT_OTHER): Admission: EM | Admit: 2023-09-16 | Discharge: 2023-09-16 | Disposition: A | Discharge status: Home / Self Care

## 2023-09-16 ENCOUNTER — Encounter (HOSPITAL_BASED_OUTPATIENT_CLINIC_OR_DEPARTMENT_OTHER): Payer: Self-pay

## 2023-09-16 ENCOUNTER — Ambulatory Visit: Payer: Self-pay

## 2023-09-16 ENCOUNTER — Ambulatory Visit (HOSPITAL_BASED_OUTPATIENT_CLINIC_OR_DEPARTMENT_OTHER): Payer: Self-pay

## 2023-09-16 DIAGNOSIS — M25562 Pain in left knee: Secondary | ICD-10-CM

## 2023-09-16 DIAGNOSIS — M791 Myalgia, unspecified site: Secondary | ICD-10-CM
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
## 2023-09-19 ENCOUNTER — Ambulatory Visit

## 2023-09-19 ENCOUNTER — Encounter: Payer: Self-pay | Admitting: Physician Assistant

## 2023-09-23 ENCOUNTER — Encounter: Payer: Self-pay | Admitting: Physician Assistant

## 2023-09-26 ENCOUNTER — Ambulatory Visit

## 2023-09-26 MED ORDER — DIPHENHYDRAMINE HCL 25 MG PO CAPS: 25.0000 mg | ORAL_CAPSULE | Freq: Once | ORAL | Status: DC

## 2023-09-26 MED ORDER — IRON SUCROSE 20 MG/ML IV SOLN
200.0000 mg | Freq: Once | INTRAVENOUS | Status: DC
Start: 2023-09-26 — End: 2023-09-29

## 2023-09-26 MED ORDER — ACETAMINOPHEN 325 MG PO TABS
650.0000 mg | ORAL_TABLET | Freq: Once | ORAL | Status: DC
Start: 2023-09-26 — End: 2023-09-29

## 2023-09-29 ENCOUNTER — Encounter: Payer: Self-pay | Admitting: Physician Assistant

## 2023-10-02 ENCOUNTER — Encounter: Payer: Self-pay | Admitting: Physician Assistant

## 2023-10-03 ENCOUNTER — Encounter: Payer: Self-pay | Admitting: Physician Assistant

## 2023-10-03 ENCOUNTER — Ambulatory Visit (INDEPENDENT_AMBULATORY_CARE_PROVIDER_SITE_OTHER)

## 2023-10-03 VITALS — BP 109/70 | HR 89 | Temp 98.4°F | Resp 16 | Ht 64.0 in | Wt 218.8 lb

## 2023-10-03 DIAGNOSIS — D509 Iron deficiency anemia, unspecified: Secondary | ICD-10-CM

## 2023-10-03 DIAGNOSIS — D62 Acute posthemorrhagic anemia: Secondary | ICD-10-CM

## 2023-10-03 MED ORDER — ACETAMINOPHEN 325 MG PO TABS: 650.0000 mg | ORAL_TABLET | Freq: Once | ORAL | Status: DC

## 2023-10-03 MED ORDER — DIPHENHYDRAMINE HCL 25 MG PO CAPS: 25.0000 mg | ORAL_CAPSULE | Freq: Once | ORAL | Status: DC

## 2023-10-03 MED ORDER — IRON SUCROSE 20 MG/ML IV SOLN
200.0000 mg | Freq: Once | INTRAVENOUS | Status: AC
  Administered 2023-10-03: 200 mg via INTRAVENOUS
  Filled 2023-10-03: qty 10

## 2023-10-03 MED ORDER — SODIUM CHLORIDE 0.9 % IV BOLUS
250.0000 mL | Freq: Once | INTRAVENOUS | Status: DC
  Filled 2023-10-03: qty 250

## 2023-10-03 NOTE — Progress Notes (Signed)
 Diagnosis: Iron  Deficiency Anemia  Provider:  Praveen Mannam MD  Procedure: IV Push  IV Type: Peripheral, IV Location: R Forearm  Venofer  (Iron  Sucrose), Dose: 200 mg  Post Infusion IV Care: Patient declined observation  Discharge: Condition: Good, Destination: Home . AVS Declined  Performed by:  Maximiano JONELLE Pouch, LPN    Patient refused pre-medications. Nurse educated patient and stressed the importance of taking pre-medications as a precaution in the event of a medication reaction. Patient verbalized understanding.

## 2023-10-09 ENCOUNTER — Ambulatory Visit (INDEPENDENT_AMBULATORY_CARE_PROVIDER_SITE_OTHER)

## 2023-10-09 VITALS — BP 111/77 | HR 77 | Temp 98.5°F | Resp 18 | Ht 64.0 in | Wt 217.2 lb

## 2023-10-09 DIAGNOSIS — D62 Acute posthemorrhagic anemia: Secondary | ICD-10-CM

## 2023-10-09 DIAGNOSIS — D509 Iron deficiency anemia, unspecified: Secondary | ICD-10-CM | POA: Diagnosis not present

## 2023-10-09 MED ORDER — DIPHENHYDRAMINE HCL 25 MG PO CAPS: 25.0000 mg | ORAL_CAPSULE | Freq: Once | ORAL | Status: DC

## 2023-10-09 MED ORDER — IRON SUCROSE 20 MG/ML IV SOLN
200.0000 mg | Freq: Once | INTRAVENOUS | Status: AC
  Administered 2023-10-09: 200 mg via INTRAVENOUS
  Filled 2023-10-09: qty 10

## 2023-10-09 MED ORDER — SODIUM CHLORIDE 0.9 % IV BOLUS
250.0000 mL | Freq: Once | INTRAVENOUS | Status: DC
  Filled 2023-10-09: qty 250

## 2023-10-09 MED ORDER — ACETAMINOPHEN 325 MG PO TABS: 650.0000 mg | ORAL_TABLET | Freq: Once | ORAL | Status: DC

## 2023-10-09 NOTE — Progress Notes (Signed)
 Diagnosis: Iron  Deficiency Anemia  Provider:  Praveen Mannam MD  Procedure: IV Push  IV Type: Peripheral, IV Location: R Antecubital  Venofer  (Iron  Sucrose), Dose: 200 mg  Post Infusion IV Care: Patient declined observation and Peripheral IV Discontinued  Discharge: Condition: Good, Destination: Home . AVS Provided  Performed by:  Leita FORBES Miles, LPN

## 2023-10-10 ENCOUNTER — Encounter: Payer: Self-pay | Admitting: Physician Assistant

## 2023-10-16 ENCOUNTER — Ambulatory Visit

## 2023-10-16 MED ORDER — ACETAMINOPHEN 325 MG PO TABS: 650.0000 mg | ORAL_TABLET | Freq: Once | ORAL | Status: DC

## 2023-10-16 MED ORDER — DIPHENHYDRAMINE HCL 25 MG PO CAPS: 25.0000 mg | ORAL_CAPSULE | Freq: Once | ORAL | Status: DC

## 2023-10-16 MED ORDER — IRON SUCROSE 20 MG/ML IV SOLN: 200.0000 mg | Freq: Once | INTRAVENOUS | Status: DC

## 2023-10-21 ENCOUNTER — Ambulatory Visit (INDEPENDENT_AMBULATORY_CARE_PROVIDER_SITE_OTHER)

## 2023-10-21 VITALS — BP 111/75 | HR 57 | Temp 98.3°F | Resp 18 | Ht 64.0 in | Wt 221.2 lb

## 2023-10-21 DIAGNOSIS — D62 Acute posthemorrhagic anemia: Secondary | ICD-10-CM

## 2023-10-21 DIAGNOSIS — D509 Iron deficiency anemia, unspecified: Secondary | ICD-10-CM

## 2023-10-21 MED ORDER — IRON SUCROSE 20 MG/ML IV SOLN
200.0000 mg | Freq: Once | INTRAVENOUS | Status: AC
  Administered 2023-10-21: 200 mg via INTRAVENOUS
  Filled 2023-10-21: qty 10

## 2023-10-21 MED ORDER — DIPHENHYDRAMINE HCL 25 MG PO CAPS: 25.0000 mg | ORAL_CAPSULE | Freq: Once | ORAL | Status: DC

## 2023-10-21 MED ORDER — ACETAMINOPHEN 325 MG PO TABS: 650.0000 mg | ORAL_TABLET | Freq: Once | ORAL | Status: DC

## 2023-10-21 NOTE — Progress Notes (Signed)
 Diagnosis: Iron Deficiency Anemia  Provider:  Chilton Greathouse MD  Procedure: IV Push  IV Type: Peripheral, IV Location: L Forearm  Venofer (Iron Sucrose), Dose: 200 mg  Post Infusion IV Care: Patient declined observation and Peripheral IV Discontinued  Discharge: Condition: Good, Destination: Home . AVS Declined  Performed by:  Adriana Mccallum, RN   Patient refused pre-medications. Nurse educated patient and stressed the importance of taking pre-medications as a precaution in the event of a medication reaction. Patient verbalized understanding.

## 2023-10-24 ENCOUNTER — Inpatient Hospital Stay: Attending: Physician Assistant

## 2023-10-30 ENCOUNTER — Telehealth: Payer: Self-pay | Admitting: *Deleted

## 2023-10-30 NOTE — Telephone Encounter (Signed)
 TCT patient regarding her scheduled appt tomorrow with Dr. Federico. Spoke with her. Advised that as she just received IV iron  last week, it is too soon to recheck her labs. Advised that we usually week 4-6 weeks to recheck. Pt voiced understanding and is fine with moving out her appt 4-6 weeks.   Advised thatone of our schedulers will call her about this. Pt voiced understanding.  Scheduling message sent

## 2023-10-31 ENCOUNTER — Inpatient Hospital Stay: Admitting: Hematology and Oncology

## 2023-12-12 ENCOUNTER — Inpatient Hospital Stay: Attending: Physician Assistant | Admitting: Hematology and Oncology

## 2023-12-12 ENCOUNTER — Inpatient Hospital Stay

## 2023-12-12 ENCOUNTER — Telehealth: Payer: Self-pay | Admitting: Hematology and Oncology

## 2023-12-12 NOTE — Telephone Encounter (Signed)
 Scheduled a lab appt for today before her office visit. Called and left a voicemail with the details.

## 2023-12-12 NOTE — Progress Notes (Deleted)
 [facility] [phone]   [phone]  PROGRESS NOTE  Patient Care Team: Tysinger, Alm RAMAN, PA-C as PCP - General (Family Medicine) Mercer Clotilda SAUNDERS, MD (Family Medicine)  Hematological/Oncological History # ***  Interval History:  Jenny Yu 38 y.o. female with medical history significant for *** presents for a follow up visit. The patient's last visit was on ***. In the interim since the last visit ***  MEDICAL HISTORY:  Past Medical History:  Diagnosis Date   Allergies    Frequent headaches    Headache(784.0)    History of blood transfusion    Trichomonas infection    Wears glasses     SURGICAL HISTORY: Past Surgical History:  Procedure Laterality Date   CESAREAN SECTION  2015   CESAREAN SECTION N/A 05/08/2020   Procedure: CESAREAN SECTION;  Surgeon: Barbette Knock, MD;  Location: [facility] LD ORS;  Service: Obstetrics;  Laterality: N/A;  Repeat Fetal intolerance, failure to dilate, late decelerations, meconium   EYE MUSCLE SURGERY     WISDOM TOOTH EXTRACTION      SOCIAL HISTORY: Social History   Socioeconomic History   Marital status: Married    Spouse name: Not on file   Number of children: Not on file   Years of education: Not on file   Highest education level: Not on file  Occupational History   Not on file  Tobacco Use   Smoking status: Never   Smokeless tobacco: Never  Vaping Use   Vaping status: Never Used  Substance and Sexual Activity   Alcohol use: No   Drug use: No   Sexual activity: Yes    Birth control/protection: None    Comment: Last intercourse 01/30/13  Other Topics Concern   Not on file  Social History Narrative   Married.   Unemployed.  02/2022.   Social Drivers of Corporate Investment Banker Strain: Not on file  Food Insecurity: No Food Insecurity (08/22/2023)   Hunger Vital Sign    Worried About Running Out of Food in the Last Year: Never true    Ran Out of Food in the Last Year: Never true   Transportation Needs: No Transportation Needs (08/22/2023)   PRAPARE - Administrator, Civil Service (Medical): No    Lack of Transportation (Non-Medical): No  Physical Activity: Not on file  Stress: Not on file  Social Connections: Unknown (06/03/2021)   Received from [facility]   Social Network    Social Network: Not on file  Intimate Partner Violence: Not At Risk (08/22/2023)   Humiliation, Afraid, Rape, and Kick questionnaire    Fear of Current or Ex-Partner: No    Emotionally Abused: No    Physically Abused: No    Sexually Abused: No    FAMILY HISTORY: Family History  Problem Relation Age of Onset   Hypertension Mother    Diabetes Father    Diabetes Paternal Grandmother    Arthritis Paternal Grandmother    Hypertension Paternal Grandmother    Heart disease Paternal Grandmother    Diabetes Paternal Grandfather    Stroke Paternal Grandfather     ALLERGIES:  is allergic to shellfish allergy, bee pollen, milk-related compounds, and peanut-containing drug products.  MEDICATIONS:  Current Outpatient Medications  Medication Sig Dispense Refill   ondansetron  (ZOFRAN -ODT) 8 MG disintegrating tablet Take 8 mg by mouth as needed for nausea.     semaglutide-weight management (WEGOVY) 0.25 MG/0.5ML SOAJ SQ injection Inject 0.25 mg into the skin.  No current facility-administered medications for this visit.    REVIEW OF SYSTEMS:   Constitutional: ( - ) fevers, ( - )  chills , ( - ) night sweats Eyes: ( - ) blurriness of vision, ( - ) double vision, ( - ) watery eyes Ears, nose, mouth, throat, and face: ( - ) mucositis, ( - ) sore throat Respiratory: ( - ) cough, ( - ) dyspnea, ( - ) wheezes Cardiovascular: ( - ) palpitation, ( - ) chest discomfort, ( - ) lower extremity swelling Gastrointestinal:  ( - ) nausea, ( - ) heartburn, ( - ) change in bowel habits Skin: ( - ) abnormal skin rashes Lymphatics: ( - ) new lymphadenopathy, ( - ) easy bruising Neurological:  ( - ) numbness, ( - ) tingling, ( - ) new weaknesses Behavioral/Psych: ( - ) mood change, ( - ) new changes  All other systems were reviewed with the patient and are negative.  PHYSICAL EXAMINATION: ECOG PERFORMANCE STATUS: {CHL ONC ECOG [phone]}  There were no vitals filed for this visit. There were no vitals filed for this visit.  GENERAL: alert, no distress and comfortable SKIN: skin color, texture, turgor are normal, no rashes or significant lesions EYES: conjunctiva are pink and non-injected, sclera clear OROPHARYNX: no exudate, no erythema; lips, buccal mucosa, and tongue normal  NECK: supple, non-tender LYMPH:  no palpable lymphadenopathy in the cervical, axillary or inguinal LUNGS: clear to auscultation and percussion with normal breathing effort HEART: regular rate & rhythm and no murmurs and no lower extremity edema ABDOMEN: soft, non-tender, non-distended, normal bowel sounds Musculoskeletal: no cyanosis of digits and no clubbing  PSYCH: alert & oriented x 3, fluent speech NEURO: no focal motor/sensory deficits  LABORATORY DATA:  I have reviewed the data as listed    Latest Ref Rng & Units 08/22/2023   12:05 PM 05/11/2020    8:29 AM 05/09/2020    4:14 AM  CBC  WBC 4.0 - 10.5 K/uL 9.4  13.7  13.2   Hemoglobin 12.0 - 15.0 g/dL 8.8  8.9  8.5   Hematocrit 36.0 - 46.0 % 30.6  28.2  26.5   Platelets 150 - 400 K/uL 512  326  272        Latest Ref Rng & Units 08/22/2023   12:05 PM 02/28/2022    3:33 PM 05/11/2020    8:29 AM  CMP  Glucose 70 - 99 mg/dL 89  896  886   BUN 6 - 20 mg/dL 6  7  <5   Creatinine 9.55 - 1.00 mg/dL 9.41  9.38  9.47   Sodium 135 - 145 mmol/L 136  138  139   Potassium 3.5 - 5.1 mmol/L 3.9  4.3  3.9   Chloride 98 - 111 mmol/L 106  101  106   CO2 22 - 32 mmol/L 23  22  25    Calcium 8.9 - 10.3 mg/dL 8.8  9.4  8.9   Total Protein 6.5 - 8.1 g/dL 7.6  7.2  5.7   Total Bilirubin 0.0 - 1.2 mg/dL 0.5  0.2  0.3   Alkaline Phos 38 - 126 U/L 48  68  84    AST 15 - 41 U/L 8  11  36   ALT 0 - 44 U/L 8  8  28      No results found for: MPROTEIN No results found for: KPAFRELGTCHN, LAMBDASER, KAPLAMBRATIO   BLOOD FILM: *** Review of the peripheral blood smear showed  normal appearing white cells with neutrophils that were appropriately lobated and granulated. There was no predominance of bi-lobed or hyper-segmented neutrophils appreciated. No Dohle bodies were noted. There was no left shifting, immature forms or blasts noted. Lymphocytes remain normal in size without any predominance of large granular lymphocytes. Red cells show no anisopoikilocytosis, macrocytes , microcytes or polychromasia. There were no schistocytes, target cells, echinocytes, acanthocytes, dacrocytes, or stomatocytes.There was no rouleaux formation, nucleated red cells, or intra-cellular inclusions noted. The platelets are normal in size, shape, and color without any clumping evident.  RADIOGRAPHIC STUDIES: I have personally reviewed the radiological images as listed and agreed with the findings in the report. No results found.  ASSESSMENT & PLAN ***  No orders of the defined types were placed in this encounter.   All questions were answered. The patient knows to call the clinic with any problems, questions or concerns.  A total of more than 30 minutes were spent on this encounter with face-to-face time and non-face-to-face time, including preparing to see the patient, ordering tests and/or medications, counseling the patient and coordination of care as outlined above.   Jenny Yu Kidney, MD Department of Hematology/Oncology [facility] Phone: [phone] Pager: [phone] Email: [email or web address]  12/12/2023 7:44 AM

## 2023-12-15 ENCOUNTER — Other Ambulatory Visit: Payer: Self-pay | Admitting: Obstetrics & Gynecology

## 2024-01-12 ENCOUNTER — Encounter (HOSPITAL_COMMUNITY): Payer: Self-pay | Admitting: Obstetrics & Gynecology

## 2024-01-12 NOTE — Progress Notes (Signed)
 Surgical Instructions  Your procedure is scheduled on :  Friday January 2nd, 2025 Report to [facility] Main Entrance A at 5:30 AM, then check in the Admitting office. Any questions or running late day of surgery :  call [phone]  Questions prior to your surgery day:  call [phone], Monday -- Friday 8am - 4pm. If you experience any cold or flu symptoms such as cough, fever, chills, shortness of breath, etc. between now and you scheduled surgery, please notify your surgeon office.   Remember: Do Not eat any food after midnight the night before surgery.     This includes No water,  candy,  gum, and mints.  Take these medicines the morning of surgery with A SIPS OF WATER:  NONE   May take these medicines IF NEEDED:  NONE   One week prior to surgery, STOP taking any Aspirin (unless otherwise instructed by your surgeon) Aleve, Naproxen, ibuprofen , Motrin , Advil , Goody's, BC's, all herbal medications/ supplements, fish oil, and non-prescription vitamins.  Do NOT Smoke (tobacco/ vaping) and Do Not drink alcohol for 24 hours prior to your procedure.  For those patients that use a CPAP.  Please bring your CPAP/ mask/ tubing with them day of surgery . Anesthesia may ask recovery room nurse to use and if you stay the night you be asked to use it.  You will be asked to removed any contacts, glasses, piercing's, hearing aid's, dentures/ partials prior to surgery.  Please bring cases/ container/ solution/ etc., for them day of surgery.   Patients discharged the day of surgery will NOT be allowed to drive home.  You must have responsible driver and caregiver to stay at home with you the next 24 hours.  SURGICAL WAITING ROOM VISITATION Patients may have no more than 2 support people in the waiting area - if more than 2 , these visitors may rotate.  Pre-op nurse will coordinate an appropriate time for 1 Adult support person, who may not rotate, to accompany patient in pre-op.  Aware some patients  may have certain circumstances, speak to pre-op nurse day of surgery.  Children under the age 8 must have an adult with them who is not the patient and must remain in the main waiting area with an adult.  If the patient needs to stay at the hospital during part of their recovery, the visitor guidelines for inpatient rooms apply.  Please refer to the [facility] website for the visitor guidelines for any additional information.  If you received a COVID test during your pre-op visit it is requested that you wear a mask when out in public, stay away from anyone that may not be feeling well and notify your surgeon if you develop symptoms.  If you have been in contact with anyone that has tested positive in the past 10 days notify your surgeon.     [facility] - Preparing for Surgery  Before surgery, you can play an important role. Because skin is not sterile, it needs to be as free of germs as possible. You can reduce the number of germs on your skin by washing with CHG (chlorhexidine gluconate) soap before surgery. CHG is an antiseptic cleaner which kills germs and bonds with the skin to continue killing germs even after washing. Oral hygiene is also important in reducing the risk of infection. Remember to brush your teeth with your regular toothpaste the morning of surgery.  Please DO NOT use if you have an allergy to CHG or antibacterial soaps.  If your skin becomes reddened/irritated stop using the CHG and inform your Pre-op nurse day of surgery.  DO NOT shave (including legs and genital area) for at least 48 hours prior to your CHG shower.   Please follow these instructions carefully:  Shower with CHG soap the night before surgery. If you choose to wash your hair, wash your hair first as usual with your normal shampoo. After you shampoo, rinse your hair and body thoroughly to remove the shampoo. Use CHG as you would any other liquid soap. You can apply CHG directly to the skin and wash  gently with a clean washcloth or shower sponge. Apply the CHG soap to your body ONLY FROM THE NECK DOWN. Do not use on open wounds or open sores. Avoid contact with your eyes, ears, mouth, and genitals (private parts). Wash genitals (private parts) with your normal soap. Wash thoroughly, paying special attention to the area where your surgery will be performed. Thoroughly rinse your body with warm water from the neck down. DO NOT shower/wash with your normal soap after using and rinsing off the CHG soap. DO NOT use lotions, oils, etc., after showering with CHG. Pat yourself dry with a clean towel. Wear clean pajamas. Place clean sheets on your bed the night of your CHG shower and do not sleep with pets.  Day of Surgery  DO NOT Apply any lotions,  powder,  oils,  deodorants (may use underarm deodorant),  cologne/  perfumes  or makeup Do Not wear jewelry /  piercing's/  metal/  permanent jewelry must be removed prior to arrival day of surgery. (No plastic piercing) Do Not wear nail polish,  gel polish,  artificial nails, or any other type of covering on natural finger nails (toe nails are okay) Remember to brush your teeth and rinse mouth out. Put on clean / comfortable clothes. [place] is not responsible for valuables/ personal belongings

## 2024-01-12 NOTE — Progress Notes (Signed)
 Spoke w/ via phone for pre-op interview--- [facility] needs dos----  UPT day of surgery. Lab appt 01/19/24 at 11am-CBC, T&S and BMP.       Lab results------ COVID test -----patient states asymptomatic no test needed Arrive at -------0530 NPO after MN NO Solid Food.   Pre-Surgery Ensure or G2:  Med rec completed Medications to take morning of surgery -----NONE Diabetic medication -----  GLP1 agonist last dose: GLP1 instructions:  Patient instructed no nail polish to be worn day of surgery Patient instructed to bring photo id and insurance card day of surgery Patient aware to have Driver (ride ) / caregiver    for 24 hours after surgery - Husband Jamar Kerlin Patient Special Instructions ----- CHG shower night before surgery. Pre-Op special Instructions -----  Patient verbalized understanding of instructions that were given at this phone interview. Patient denies chest pain, sob, fever, cough at the interview.

## 2024-01-19 ENCOUNTER — Encounter (HOSPITAL_COMMUNITY)
Admission: RE | Admit: 2024-01-19 | Discharge: 2024-01-19 | Disposition: A | Discharge status: Home / Self Care | Source: Ambulatory Visit | Attending: Obstetrics & Gynecology | Admitting: Obstetrics & Gynecology

## 2024-01-19 DIAGNOSIS — Z01812 Encounter for preprocedural laboratory examination: Secondary | ICD-10-CM | POA: Diagnosis present

## 2024-01-19 LAB — BASIC METABOLIC PANEL WITH GFR
Anion gap: 9 (ref 5–15)
BUN: 7 mg/dL (ref 6–20)
CO2: 25 mmol/L (ref 22–32)
Calcium: 8.5 mg/dL — ABNORMAL LOW (ref 8.9–10.3)
Chloride: 105 mmol/L (ref 98–111)
Creatinine, Ser: 0.59 mg/dL (ref 0.44–1.00)
GFR, Estimated: >60 mL/min
Glucose, Bld: 96 mg/dL (ref 70–99)
Potassium: 4.4 mmol/L (ref 3.5–5.1)
Sodium: 139 mmol/L (ref 135–145)

## 2024-01-19 LAB — CBC
HCT: 40.1 % (ref 36.0–46.0)
Hemoglobin: 12.9 g/dL (ref 12.0–15.0)
MCH: 26.8 pg (ref 26.0–34.0)
MCHC: 32.2 g/dL (ref 30.0–36.0)
MCV: 83.2 fL (ref 80.0–100.0)
Platelets: 386 K/uL (ref 150–400)
RBC: 4.82 MIL/uL (ref 3.87–5.11)
RDW: 13.6 % (ref 11.5–15.5)
WBC: 7.3 K/uL (ref 4.0–10.5)
nRBC: 0 % (ref 0.0–0.2)

## 2024-01-19 LAB — TYPE AND SCREEN
ABO/RH(D): O POS
Antibody Screen: NEGATIVE

## 2024-01-22 NOTE — Anesthesia Preprocedure Evaluation (Signed)
"                                    Anesthesia Evaluation  Patient identified by MRN, date of birth, ID band Patient awake    Reviewed: Allergy & Precautions, NPO status , Patient's Chart, lab work & pertinent test results  Airway Mallampati: II  TM Distance: >3 FB Neck ROM: Full    Dental  (+) Teeth Intact, Dental Advisory Given   Pulmonary neg pulmonary ROS, Not current smoker   Pulmonary exam normal breath sounds clear to auscultation       Cardiovascular Exercise Tolerance: Good negative cardio ROS Normal cardiovascular exam Rhythm:Regular Rate:Normal     Neuro/Psych  Headaches PSYCHIATRIC DISORDERS Anxiety        GI/Hepatic negative GI ROS, Neg liver ROS,,,  Endo/Other  Obesity   Renal/GU negative Renal ROS     Musculoskeletal negative musculoskeletal ROS (+)    Abdominal   Peds  Hematology negative hematology ROS (+)   Anesthesia Other Findings   Reproductive/Obstetrics menometrorrhagia. menorrhagia with irregular cycle                              Anesthesia Physical Anesthesia Plan  ASA: 2  Anesthesia Plan: General   Post-op Pain Management: Tylenol  PO (pre-op)* and Toradol  IV (intra-op)*   Induction: Intravenous  PONV Risk Score and Plan: 4 or greater and Scopolamine  patch - Pre-op, Midazolam, Dexamethasone  and Ondansetron   Airway Management Planned: Oral ETT  Additional Equipment:   Intra-op Plan:   Post-operative Plan: Extubation in OR  Informed Consent: I have reviewed the patients History and Physical, chart, labs and discussed the procedure including the risks, benefits and alternatives for the proposed anesthesia with the patient or authorized representative who has indicated his/her understanding and acceptance.     Dental advisory given  Plan Discussed with: CRNA  Anesthesia Plan Comments: (2nd PIV after induction)         Anesthesia Quick Evaluation  "

## 2024-01-22 NOTE — H&P (Signed)
 Jenny Yu is an 38 y.o. female presenting for hysterectomy for menorrhagia and pain due to failed medical management.   Periods are more frequent every 3-4 weeks and very heavy and painful. Bleeds through tampons 6-8 in a day and has to use towel at night Last visit had discussed hysterectomy Sono Oct'25 - Pelvic sono-uterus, 8 x 5 x 4 cm, EMS 13 mm, 2 polyps noted but uterine walls are thin, Left ovary normal.  Right ovary with 3.4 x2.3 cm simple cyst, normal blood flow.  No free fluid.  Endometrial biopsy- Benign  G1 preterm 30w primay c/s for mono-mono twin girls. Did TL at that C/s. Then tubal reversal and spontaneous pregnancy, G2 05/08/20 @ 41.1 emergency repeat c/s failed IOL NRFHT  Married. Pap 12/10/21 NILM/-HPV no hx abnormals Breast concerns: none today. Did have Mammogram done in 2024 at a mobile free clinic-- normal but dense breast tissue noted Family hx: no known breast or GYN cancers Does not have a current PCP Obesity   Patient's last menstrual period was 12/29/2023 (exact date).    Past Medical History:  Diagnosis Date   Allergies    Frequent headaches    Headache(784.0)    History of blood transfusion    Pre-diabetes    Trichomonas infection    Wears glasses     Past Surgical History:  Procedure Laterality Date   CESAREAN SECTION  2015   CESAREAN SECTION N/A 05/08/2020   Procedure: CESAREAN SECTION;  Surgeon: Barbette Knock, MD;  Location: [facility] LD ORS;  Service: Obstetrics;  Laterality: N/A;  Repeat Fetal intolerance, failure to dilate, late decelerations, meconium   EYE MUSCLE SURGERY     WISDOM TOOTH EXTRACTION      Family History  Problem Relation Age of Onset   Hypertension Mother    Diabetes Father    Diabetes Paternal Grandmother    Arthritis Paternal Grandmother    Hypertension Paternal Grandmother    Heart disease Paternal Grandmother    Diabetes Paternal Grandfather    Stroke Paternal Grandfather     Social History:  reports that she has  never smoked. She has never used smokeless tobacco. She reports that she does not drink alcohol and does not use drugs.  Allergies: Allergies[1]  No medications prior to admission.    Review of Systems neg  Weight 99.8 kg, last menstrual period 12/29/2023, not currently breastfeeding. Physical Exam Physical exam:  A&O x 3, no acute distress. Pleasant HEENT neg, no thyromegaly Lungs CTA bilat CV RRR, S1S2 normal Abdo soft, non tender, non acute Extr no edema/ tenderness Pelvic  uterus AV  10wks limited mobility  Nl adnexa and cervix      Latest Ref Rng & Units 01/19/2024   11:07 AM    CBC  WBC 4.0 - 10.5 K/uL 7.3     Hemoglobin 12.0 - 15.0 g/dL 87.0     Hematocrit 63.9 - 46.0 % 40.1     Platelets 150 - 400 K/uL 386         Assessment/Plan: 39 yo female with menorrhagia, failed medical therapy, desires permanent solution with hysterectomy  Risks/complications of surgery reviewed incl infection, bleeding, damage to internal organs including bladder, bowels, ureters, blood vessels, other risks from anesthesia, VTE and delayed complications of any surgery, complications in future surgery reviewed.     Marven Veley R Keondra Haydu 01/22/2024, 7:34 PM     [1]  Allergies Allergen Reactions   Shellfish Allergy Anaphylaxis   Bee Pollen    Milk-Related Compounds  Diarrhea   Peanut-Containing Drug Products Swelling    Causes swelling of the tongue.

## 2024-01-23 ENCOUNTER — Other Ambulatory Visit: Payer: Self-pay

## 2024-01-23 ENCOUNTER — Other Ambulatory Visit (HOSPITAL_COMMUNITY): Payer: Self-pay

## 2024-01-23 ENCOUNTER — Ambulatory Visit (HOSPITAL_COMMUNITY)
Admission: RE | Admit: 2024-01-23 | Discharge: 2024-01-23 | Disposition: A | Discharge status: Home / Self Care | Attending: Obstetrics & Gynecology | Admitting: Obstetrics & Gynecology

## 2024-01-23 ENCOUNTER — Ambulatory Visit (HOSPITAL_COMMUNITY): Admitting: Anesthesiology

## 2024-01-23 ENCOUNTER — Encounter (HOSPITAL_COMMUNITY)
Admission: RE | Disposition: A | Discharge status: Home / Self Care | Payer: Self-pay | Source: Home / Self Care | Attending: Obstetrics & Gynecology

## 2024-01-23 ENCOUNTER — Encounter (HOSPITAL_COMMUNITY): Payer: Self-pay | Admitting: Obstetrics & Gynecology

## 2024-01-23 DIAGNOSIS — N921 Excessive and frequent menstruation with irregular cycle: Secondary | ICD-10-CM | POA: Insufficient documentation

## 2024-01-23 DIAGNOSIS — E669 Obesity, unspecified: Secondary | ICD-10-CM | POA: Insufficient documentation

## 2024-01-23 DIAGNOSIS — N879 Dysplasia of cervix uteri, unspecified: Secondary | ICD-10-CM | POA: Insufficient documentation

## 2024-01-23 DIAGNOSIS — Z6837 Body mass index (BMI) 37.0-37.9, adult: Secondary | ICD-10-CM | POA: Insufficient documentation

## 2024-01-23 DIAGNOSIS — N838 Other noninflammatory disorders of ovary, fallopian tube and broad ligament: Secondary | ICD-10-CM | POA: Diagnosis not present

## 2024-01-23 HISTORY — DX: Prediabetes: R73.03

## 2024-01-23 HISTORY — PX: HYSTERECTOMY, TOTAL, LAPAROSCOPIC, ROBOT-ASSISTED WITH SALPINGECTOMY: SHX7587

## 2024-01-23 LAB — POCT PREGNANCY, URINE: Preg Test, Ur: NEGATIVE

## 2024-01-23 SURGERY — HYSTERECTOMY, TOTAL, LAPAROSCOPIC, ROBOT-ASSISTED WITH SALPINGECTOMY
Anesthesia: General | Site: Pelvis | Laterality: Bilateral

## 2024-01-23 MED ORDER — PROPOFOL 10 MG/ML IV BOLUS
INTRAVENOUS | Status: DC | PRN
  Administered 2024-01-23: 200 mg via INTRAVENOUS

## 2024-01-23 MED ORDER — FENTANYL CITRATE (PF) 100 MCG/2ML IJ SOLN
INTRAMUSCULAR | Status: AC
  Filled 2024-01-23: qty 2

## 2024-01-23 MED ORDER — ACETAMINOPHEN 325 MG PO TABS: 650.0000 mg | ORAL_TABLET | Freq: Four times a day (QID) | ORAL | Status: AC | PRN

## 2024-01-23 MED ORDER — CEFAZOLIN SODIUM-DEXTROSE 2-4 GM/100ML-% IV SOLN
2.0000 g | INTRAVENOUS | Status: AC
  Administered 2024-01-23: 2 g via INTRAVENOUS
  Filled 2024-01-23: qty 100

## 2024-01-23 MED ORDER — LACTATED RINGERS IV SOLN: INTRAVENOUS | Status: DC

## 2024-01-23 MED ORDER — PHENYLEPHRINE 80 MCG/ML (10ML) SYRINGE FOR IV PUSH (FOR BLOOD PRESSURE SUPPORT)
PREFILLED_SYRINGE | INTRAVENOUS | Status: AC
  Filled 2024-01-23: qty 10

## 2024-01-23 MED ORDER — ONDANSETRON HCL 4 MG/2ML IJ SOLN: 4.0000 mg | Freq: Once | INTRAMUSCULAR | Status: DC | PRN

## 2024-01-23 MED ORDER — ORAL CARE MOUTH RINSE: 15.0000 mL | Freq: Once | OROMUCOSAL | Status: AC

## 2024-01-23 MED ORDER — DEXAMETHASONE SOD PHOSPHATE PF 10 MG/ML IJ SOLN
INTRAMUSCULAR | Status: DC | PRN
  Administered 2024-01-23: 10 mg via INTRAVENOUS

## 2024-01-23 MED ORDER — ROPIVACAINE HCL 5 MG/ML IJ SOLN
INTRAMUSCULAR | Status: AC
  Filled 2024-01-23: qty 30

## 2024-01-23 MED ORDER — FENTANYL CITRATE (PF) 100 MCG/2ML IJ SOLN
25.0000 ug | INTRAMUSCULAR | Status: DC | PRN
  Administered 2024-01-23: 50 ug via INTRAVENOUS

## 2024-01-23 MED ORDER — SODIUM CHLORIDE 0.9 % IV SOLN
INTRAVENOUS | Status: DC | PRN
  Administered 2024-01-23: 60 mL

## 2024-01-23 MED ORDER — 0.9 % SODIUM CHLORIDE (POUR BTL) OPTIME
TOPICAL | Status: DC | PRN
  Administered 2024-01-23: 1000 mL

## 2024-01-23 MED ORDER — ACETAMINOPHEN 500 MG PO TABS
1000.0000 mg | ORAL_TABLET | Freq: Once | ORAL | Status: AC
  Administered 2024-01-23: 1000 mg via ORAL
  Filled 2024-01-23: qty 2

## 2024-01-23 MED ORDER — PHENYLEPHRINE 80 MCG/ML (10ML) SYRINGE FOR IV PUSH (FOR BLOOD PRESSURE SUPPORT)
PREFILLED_SYRINGE | INTRAVENOUS | Status: DC | PRN
  Administered 2024-01-23: 160 ug via INTRAVENOUS

## 2024-01-23 MED ORDER — SODIUM CHLORIDE 0.9 % IR SOLN
Status: DC | PRN
  Administered 2024-01-23: 1000 mL

## 2024-01-23 MED ORDER — KETOROLAC TROMETHAMINE 30 MG/ML IJ SOLN
INTRAMUSCULAR | Status: DC | PRN
  Administered 2024-01-23: 30 mg via INTRAVENOUS

## 2024-01-23 MED ORDER — FENTANYL CITRATE (PF) 100 MCG/2ML IJ SOLN
INTRAMUSCULAR | Status: DC | PRN
  Administered 2024-01-23: 100 ug via INTRAVENOUS
  Administered 2024-01-23: 50 ug via INTRAVENOUS

## 2024-01-23 MED ORDER — ROCURONIUM BROMIDE 10 MG/ML (PF) SYRINGE
PREFILLED_SYRINGE | INTRAVENOUS | Status: AC
  Filled 2024-01-23: qty 10

## 2024-01-23 MED ORDER — ONDANSETRON HCL 4 MG/2ML IJ SOLN
INTRAMUSCULAR | Status: DC | PRN
  Administered 2024-01-23: 4 mg via INTRAVENOUS

## 2024-01-23 MED ORDER — CHLORHEXIDINE GLUCONATE 0.12 % MT SOLN
15.0000 mL | Freq: Once | OROMUCOSAL | Status: AC
  Administered 2024-01-23: 15 mL via OROMUCOSAL
  Filled 2024-01-23: qty 15

## 2024-01-23 MED ORDER — MIDAZOLAM HCL 2 MG/2ML IJ SOLN
INTRAMUSCULAR | Status: AC
  Filled 2024-01-23: qty 2

## 2024-01-23 MED ORDER — OXYCODONE HCL 5 MG PO TABS
5.0000 mg | ORAL_TABLET | Freq: Four times a day (QID) | ORAL | 0 refills | Status: AC | PRN
  Filled 2024-01-23: qty 20, 5d supply, fill #0

## 2024-01-23 MED ORDER — ROCURONIUM BROMIDE 100 MG/10ML IV SOLN
INTRAVENOUS | Status: DC | PRN
  Administered 2024-01-23: 60 mg via INTRAVENOUS
  Administered 2024-01-23: 20 mg via INTRAVENOUS

## 2024-01-23 MED ORDER — PROPOFOL 10 MG/ML IV BOLUS
INTRAVENOUS | Status: AC
  Filled 2024-01-23: qty 20

## 2024-01-23 MED ORDER — POVIDONE-IODINE 10 % EX SWAB
2.0000 | Freq: Once | CUTANEOUS | Status: AC
  Administered 2024-01-23: 2 via TOPICAL

## 2024-01-23 MED ORDER — MIDAZOLAM HCL 5 MG/5ML IJ SOLN
INTRAMUSCULAR | Status: DC | PRN
  Administered 2024-01-23: 2 mg via INTRAVENOUS

## 2024-01-23 MED ORDER — IBUPROFEN 200 MG PO TABS
600.0000 mg | ORAL_TABLET | Freq: Four times a day (QID) | ORAL | 0 refills | Status: AC | PRN
  Filled 2024-01-23: qty 30, 3d supply, fill #0

## 2024-01-23 MED ORDER — LIDOCAINE 2% (20 MG/ML) 5 ML SYRINGE
INTRAMUSCULAR | Status: AC
  Filled 2024-01-23: qty 5

## 2024-01-23 MED ORDER — SODIUM CHLORIDE (PF) 0.9 % IJ SOLN
INTRAMUSCULAR | Status: AC
  Filled 2024-01-23: qty 50

## 2024-01-23 MED ORDER — AMISULPRIDE (ANTIEMETIC) 5 MG/2ML IV SOLN: 10.0000 mg | Freq: Once | INTRAVENOUS | Status: DC | PRN

## 2024-01-23 MED ORDER — SUGAMMADEX SODIUM 200 MG/2ML IV SOLN
INTRAVENOUS | Status: DC | PRN
  Administered 2024-01-23: 200 mg via INTRAVENOUS

## 2024-01-23 MED ORDER — LACTATED RINGERS IV SOLN: INTRAVENOUS | Status: DC | PRN

## 2024-01-23 MED ORDER — LIDOCAINE HCL (CARDIAC) PF 100 MG/5ML IV SOSY
PREFILLED_SYRINGE | INTRAVENOUS | Status: DC | PRN
  Administered 2024-01-23: 100 mg via INTRAVENOUS

## 2024-01-23 SURGICAL SUPPLY — 43 items
COVER BACK TABLE 60X90IN (DRAPES) ×1 IMPLANT
COVER TIP SHEARS 8 DVNC (MISCELLANEOUS) ×1 IMPLANT
DEFOGGER SCOPE WARM SEASHARP (MISCELLANEOUS) ×1 IMPLANT
DERMABOND ADVANCED .7 DNX12 (GAUZE/BANDAGES/DRESSINGS) ×1 IMPLANT
DRAPE ARM DVNC X/XI (DISPOSABLE) ×4 IMPLANT
DRAPE COLUMN DVNC XI (DISPOSABLE) ×1 IMPLANT
DRAPE SURG IRRIG POUCH 19X23 (DRAPES) ×1 IMPLANT
DRAPE UTILITY XL STRL (DRAPES) ×1 IMPLANT
DRIVER NDLE MEGA SUTCUT DVNCXI (INSTRUMENTS) ×1 IMPLANT
DRSG XEROFORM 1X8 (GAUZE/BANDAGES/DRESSINGS) IMPLANT
ELECTRODE REM PT RTRN 9FT ADLT (ELECTROSURGICAL) ×1 IMPLANT
FORCEPS BPLR 8 MD DVNC XI (FORCEP) ×1 IMPLANT
FORCEPS BPLR FENES DVNC XI (FORCEP) IMPLANT
FORCEPS BPLR LNG DVNC XI (INSTRUMENTS) ×1 IMPLANT
FORCEPS PROGRASP DVNC XI (FORCEP) ×1 IMPLANT
GAUZE 4X4 16PLY ~~LOC~~+RFID DBL (SPONGE) ×1 IMPLANT
GLOVE BIO SURGEON STRL SZ7 (GLOVE) ×3 IMPLANT
GLOVE BIOGEL PI IND STRL 6 (GLOVE) IMPLANT
GLOVE BIOGEL PI IND STRL 7.0 (GLOVE) ×5 IMPLANT
GLOVE BIOGEL PI IND STRL 7.5 (GLOVE) IMPLANT
GOWN STRL SURGICAL XL XLNG (GOWN DISPOSABLE) IMPLANT
IRRIGATION STRYKERFLOW (MISCELLANEOUS) ×1 IMPLANT
KIT PINK PAD W/HEAD ARM REST (MISCELLANEOUS) ×2 IMPLANT
LEGGING LITHOTOMY PAIR STRL (DRAPES) ×1 IMPLANT
OBTURATOR OPTICALSTD 8 DVNC (TROCAR) ×1 IMPLANT
OCCLUDER COLPOPNEUMO (BALLOONS) ×1 IMPLANT
PACK ROBOT WH (CUSTOM PROCEDURE TRAY) ×1 IMPLANT
PACK ROBOTIC GOWN (GOWN DISPOSABLE) ×1 IMPLANT
PAD OB MATERNITY 11 LF (PERSONAL CARE ITEMS) ×1 IMPLANT
SCISSORS MNPLR CVD DVNC XI (INSTRUMENTS) ×1 IMPLANT
SEAL UNIV 5-12 XI (MISCELLANEOUS) ×3 IMPLANT
SEALER VESSEL EXT DVNC XI (MISCELLANEOUS) ×1 IMPLANT
SET TRI-LUMEN FLTR TB AIRSEAL (TUBING) ×1 IMPLANT
SOLN 0.9% NACL POUR BTL 1000ML (IV SOLUTION) IMPLANT
SPIKE FLUID TRANSFER (MISCELLANEOUS) ×2 IMPLANT
SUT VIC AB 4-0 PS2 18 (SUTURE) ×2 IMPLANT
SUT VICRYL 0 UR6 27IN ABS (SUTURE) ×1 IMPLANT
SUT VLOC 180 0 9IN GS21 (SUTURE) ×1 IMPLANT
TIP UTERINE 6.7X8CM BLUE DISP (MISCELLANEOUS) IMPLANT
TOWEL GREEN STERILE (TOWEL DISPOSABLE) ×1 IMPLANT
TRAY FOL W/BAG SLVR 16FR STRL (SET/KITS/TRAYS/PACK) IMPLANT
TROCAR PORT AIRSEAL 5X120 (TROCAR) ×1 IMPLANT
UNDERPAD 30X36 HEAVY ABSORB (UNDERPADS AND DIAPERS) ×1 IMPLANT

## 2024-01-23 NOTE — Op Note (Signed)
 Jenny Yu   1985/12/30  Preoperative diagnosis:  Menorrhagia, failed medical therapy  Postop diagnosis: Same Procedure: da Vinci robot assisted total laparoscopic hysterectomy and bilateral salpingectomy Anesthesia Gen. Endotracheal Surgeon: Dr. Robbi Render Assistant: Daine Abbot RNFA   IV fluids: 800 cc LR  EBL: 50 cc Urine output: 450 cc, clear in foley Complications: none Pathology: Uterus with cervix and both fallopian tubes Disposition: PACU, stable Findings: Normal uterus and ovaries and fallopian tubes. Normal peristasis of both ureters before and at the end of surgery   Procedure:  Indication: --Menorrhagia, failed medical therapy.  Complications of surgery including infection, bleeding, damage to internal organs and other surgery related problems including pneumonia, VTE reviewed and informed written consent was obtained for said surgery.  Patient was brought to the operating room with IV running. She received 2 gm Ancef  . She underwent general anesthesia without difficulty and was given dorsal lithotomy position, prepped and draped in sterile fashion. Foley catheter was placed. Cervix was exposed with a speculum and anterior lip of the cervix was grasped with tenaculum. Uterus was sounded to 8cm. A  # 8 Rumi tip and a Medium Koh ring was assembled on the [employer] and entered in the uterine cavity and balloon was inflated to secure it in place. Koh ring was palpated again cervico-vaginal junction. Speculum was removed, tenaculum was left on the cervix.  Attention was focused on abdomen. Supraumbilical 10 mm vertical incision made with scalpel after injecting Ropivacaine, fascia dissected, grasped with Kocher's and incised, posterior rectus sheath and peritoneum grasped, incised, intraabdominal entry confirmed. Purse string stay stitch on 0-Vicryl taken on fascia and Robot cannula with reducer introduced and Vicryl sutures secured. Pneumoperitoneum created. Laparoscope was  introduced and the peritoneal cavity was evaluated. There was no evidence of adhesions except some anterior bladder adhesions. Trendelenburg position given.   Port sited marked and injected with Ropivacaine. Two Robotic cannulas inserted on right side and one on the left side under vision and one Airflow on left. Robot docked from left. Arm 1 with Bipolar fenestrated grasper, arm 2 camera, arm 3 scissors, arm 4 prograsp and energy cords attached. Dr. Render scrubbed out and went for surgical console.   Uterus was deviated to the patient's right. The left salpingectomy performed by desiccating and cutting mesosalpinx. Then Left utero-ovarian ligament desiccated and cut, followed by broad ligament posteriorly, left Round ligament desiccated and cut. Anterior bladder broad ligament was opened and incised. Posterior broad ligament incised up to the uterosacral ligament and left uterine vessels skeletonized. Uterus was deviated to the left and right salpingectomy performed with desiccating and cutting mesosalpinx. Then right utero-ovarian ligament desiccated and cut followed by broad ligament and right Round ligament. Anterior broad ligament was incised to create bladder flap, bladder was pushed away by blunt and sharp dissection with excellent hemostasis. Koh ring impression at cervicovaginal junction was seen well anteriorly. Right posterior broad ligament dissected, right Uterine vessels skeletonized. Right uterine vessels were desiccated and cut. Uterus was deviated to the right and the left uterine vessels were desiccated and cut. Vaginal occluder was inflated. Colpotomy was begun starting from midline anteriorly coming to the left and right and then circumferentially staying above the uterosacral ligaments posteriorly. Uterus, cervix, both tubes pulled out of the vaginal opening and vaginal occluder placed back to maintain pneumoperitoneum.  Vaginal cut edges were evaluated for hemostasis which was excellent.  Irrigation was performed pedicles appeared dry. Robotic instruments switched for needle driver in arm 3.  0-V-lock used  for cuff closure from right to left and returning to right in 2 layers. Hemostasis noted. Irrigation was performed, all pedicles appeared to be hemostatic. Remaining Ropivacaine instilled in pelvis. All instruments removed. Robot was undocked. All cannulas were removed under vision. Stay sutures at the fascia tied together after lifting and checking free of bowel and omentum. Excellent fascial closure noted.  Skin approximated with subcuticular stitches on 4-0 Vicryl. Dermabond was applied. No vaginal bleeding noted on vaginal exam at the end. Foley removed.  All counts were correct x2.  No complications. Patient tolerated procedure well and was reversed from anesthesia and brought to the PACU stable condition.  Plan is for discharge from PACU. Surgical findings and post op care reviewed with husband  Dr Barbette was the surgeon for entire case.    --Robbi Barbette MD

## 2024-01-23 NOTE — Progress Notes (Signed)
 50mcg Fentanyl  wasted in stericycle with Lancie Clark Curry RN   Marty Needles, RN

## 2024-01-23 NOTE — Discharge Summary (Signed)
 Physician Discharge Summary  Patient ID: Jenny Yu MRN: [id number] DOB/AGE: 09-29-1985 38 y.o.  Admit date: 01/23/2024 Discharge date: 01/23/2024  Admission Diagnoses:  menorrhagia, failed medical therapy  Discharge Diagnoses: same  S/p Da Vinci assisted total laparoscopic hysterectomy and bilateral salpingectomy   Discharged Condition: good  Hospital Course: uncomplicated surgery and PACU recovery   Disposition: Discharge disposition: 01-Home or Self Care      Discharge Instructions     Call MD for:   Complete by: As directed    Vaginal bleeding more than spots   Call MD for:  difficulty breathing, headache or visual disturbances   Complete by: As directed    Call MD for:  hives   Complete by: As directed    Call MD for:  persistant dizziness or light-headedness   Complete by: As directed    Call MD for:  persistant nausea and vomiting   Complete by: As directed    Call MD for:  redness, tenderness, or signs of infection (pain, swelling, redness, odor or green/yellow discharge around incision site)   Complete by: As directed    Call MD for:  severe uncontrolled pain   Complete by: As directed    Call MD for:  temperature >100.4   Complete by: As directed    Driving Restrictions   Complete by: As directed    2 weeks   Increase activity slowly   Complete by: As directed    Lifting restrictions   Complete by: As directed    Less than 10 pounds for 6 weeks   No dressing needed   Complete by: As directed    Sexual Activity Restrictions   Complete by: As directed    6 weeks      Allergies as of 01/23/2024       Reactions   Shellfish Allergy Anaphylaxis   Bee Pollen    Milk-related Compounds Diarrhea   Peanut-containing Drug Products Swelling   Causes swelling of the tongue.        Medication List     TAKE these medications    acetaminophen  325 MG tablet Commonly known as: Tylenol  Take 2 tablets (650 mg total) by mouth every 6 (six) hours as  needed.   ibuprofen  200 MG tablet Commonly known as: Motrin  IB Take 3 tablets (600 mg total) by mouth every 6 (six) hours as needed.   ondansetron  8 MG disintegrating tablet Commonly known as: ZOFRAN -ODT Take 8 mg by mouth as needed for nausea.   oxyCODONE  5 MG immediate release tablet Commonly known as: Roxicodone  Take 1 tablet (5 mg total) by mouth every 6 (six) hours as needed for up to 5 days for severe pain (pain score 7-10).   Wegovy 0.25 MG/0.5ML Soaj SQ injection Generic drug: semaglutide-weight management Inject 0.25 mg into the skin.               Discharge Care Instructions  (From admission, onward)           Start     Ordered   01/23/24 0000  No dressing needed        01/23/24 1003            Follow-up Information     Barbette Knock, MD Follow up in 2 week(s).   Specialty: Obstetrics and Gynecology Contact information: [address] [place] [address] [phone]                 Signed: Knock JONELLE Barbette 01/23/2024, 10:04 AM

## 2024-01-23 NOTE — Anesthesia Postprocedure Evaluation (Signed)
"   Anesthesia Post Note  Patient: Jenny Yu  Procedure(s) Performed: HYSTERECTOMY, TOTAL, LAPAROSCOPIC, ROBOT-ASSISTED WITH SALPINGECTOMY (Bilateral: Pelvis)     Patient location during evaluation: PACU Anesthesia Type: General Level of consciousness: awake and alert Pain management: pain level controlled Vital Signs Assessment: post-procedure vital signs reviewed and stable Respiratory status: spontaneous breathing, nonlabored ventilation and respiratory function stable Cardiovascular status: blood pressure returned to baseline and stable Postop Assessment: no apparent nausea or vomiting Anesthetic complications: no   No notable events documented.  Last Vitals:  Vitals:   01/23/24 1130 01/23/24 1145  BP: 130/72 127/71  Pulse: 84 84  Resp: 18 16  Temp:  36.7 C  SpO2: 96% 96%    Last Pain:  Vitals:   01/23/24 1145  TempSrc:   PainSc: 0-No pain                 Garnette FORBES Skillern      "

## 2024-01-23 NOTE — Anesthesia Procedure Notes (Signed)
 Procedure Name: Intubation Date/Time: 01/23/2024 7:37 AM  Performed by: Sidrah Harden, Corean BROCKS, CRNAPre-anesthesia Checklist: Patient identified, Emergency Drugs available, Suction available and Patient being monitored Patient Re-evaluated:Patient Re-evaluated prior to induction Oxygen Delivery Method: Circle system utilized Preoxygenation: Pre-oxygenation with 100% oxygen Induction Type: IV induction Ventilation: Mask ventilation without difficulty Laryngoscope Size: Mac and 3 Grade View: Grade I Tube type: Oral Tube size: 7.0 mm Number of attempts: 1 Airway Equipment and Method: Stylet and Oral airway Placement Confirmation: ETT inserted through vocal cords under direct vision, positive ETCO2 and breath sounds checked- equal and bilateral Secured at: 20 cm Tube secured with: Tape Dental Injury: Teeth and Oropharynx as per pre-operative assessment

## 2024-01-23 NOTE — Transfer of Care (Signed)
 Immediate Anesthesia Transfer of Care Note  Patient: Jenny Yu  Procedure(s) Performed: HYSTERECTOMY, TOTAL, LAPAROSCOPIC, ROBOT-ASSISTED WITH SALPINGECTOMY (Bilateral: Pelvis)  Patient Location: PACU  Anesthesia Type:General  Level of Consciousness: awake and drowsy  Airway & Oxygen Therapy: Patient Spontanous Breathing and Patient connected to nasal cannula oxygen  Post-op Assessment: Report given to RN and Post -op Vital signs reviewed and stable  Post vital signs: Reviewed and stable  Last Vitals:  Vitals Value Taken Time  BP 139/80 01/23/24 09:47  Temp    Pulse 90 01/23/24 09:50  Resp 18 01/23/24 09:50  SpO2 100 % 01/23/24 09:50  Vitals shown include unfiled device data.  Last Pain:  Vitals:   01/23/24 0628  TempSrc:   PainSc: 0-No pain         Complications: No notable events documented.

## 2024-01-24 ENCOUNTER — Encounter (HOSPITAL_COMMUNITY): Payer: Self-pay | Admitting: Obstetrics & Gynecology

## 2024-01-26 LAB — SURGICAL PATHOLOGY
